# Patient Record
Sex: Female | Born: 1970
Health system: Southern US, Community
[De-identification: ages and names within clinical notes are randomized; demographics above are authoritative.]

## PROBLEM LIST (undated history)

## (undated) ENCOUNTER — Inpatient Hospital Stay (HOSPITAL_COMMUNITY): Payer: Self-pay

## (undated) DIAGNOSIS — E785 Hyperlipidemia, unspecified: Secondary | ICD-10-CM

## (undated) DIAGNOSIS — T7840XA Allergy, unspecified, initial encounter: Secondary | ICD-10-CM

## (undated) HISTORY — DX: Allergy, unspecified, initial encounter: T78.40XA

## (undated) HISTORY — DX: Hyperlipidemia, unspecified: E78.5

---

## 2009-10-16 ENCOUNTER — Other Ambulatory Visit: Admission: RE | Admit: 2009-10-16 | Discharge: 2009-10-16 | Payer: Self-pay | Admitting: Family Medicine

## 2009-10-16 ENCOUNTER — Encounter: Payer: Self-pay | Admitting: Family Medicine

## 2009-10-16 ENCOUNTER — Ambulatory Visit: Payer: Self-pay | Admitting: Family Medicine

## 2009-10-16 DIAGNOSIS — Z87898 Personal history of other specified conditions: Secondary | ICD-10-CM

## 2009-10-16 DIAGNOSIS — R8789 Other abnormal findings in specimens from female genital organs: Secondary | ICD-10-CM

## 2009-10-16 LAB — CONVERTED CEMR LAB: GC Probe Amp, Genital: NEGATIVE

## 2009-10-17 ENCOUNTER — Telehealth: Payer: Self-pay | Admitting: *Deleted

## 2009-10-24 ENCOUNTER — Ambulatory Visit: Payer: Self-pay | Admitting: Family Medicine

## 2009-10-24 ENCOUNTER — Encounter: Payer: Self-pay | Admitting: Family Medicine

## 2009-10-24 LAB — CONVERTED CEMR LAB
AST: 21 units/L (ref 0–37)
BUN: 10 mg/dL (ref 6–23)
Calcium: 9.3 mg/dL (ref 8.4–10.5)
Chloride: 104 meq/L (ref 96–112)
Cholesterol: 298 mg/dL — ABNORMAL HIGH (ref 0–200)
Creatinine, Ser: 0.77 mg/dL (ref 0.40–1.20)
HDL: 65 mg/dL (ref >39)
Total Bilirubin: 0.4 mg/dL (ref 0.3–1.2)
Total CHOL/HDL Ratio: 4.6
VLDL: 11 mg/dL (ref 0–40)

## 2009-10-25 ENCOUNTER — Encounter: Payer: Self-pay | Admitting: Family Medicine

## 2010-05-07 NOTE — Assessment & Plan Note (Signed)
Summary: NP,tcb   Vital Signs:  Patient profile:   40 year old female Height:      70 inches Weight:      127.6 pounds BMI:     18.37 Temp:     98.7 degrees F oral Pulse rate:   90 / minute BP sitting:   120 / 84  (left arm) Cuff size:   regular  Vitals Entered By: Garen Grams LPN (October 16, 2009 1:44 PM) CC: New Patient Is Patient Diabetic? No Pain Assessment Patient in pain? no        CC:  New Patient.  History of Present Illness: pt presents for new pt appt.    has had mammograms in past.  they have been normal.  has had an abnormal pap, not sure what the abnormality was.  has had normal colpo in past.   wants to have another baby in the next year or 2.  recently remarried.  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 Take One Daily  Allergies (verified): No Known Drug Allergies  Past History:  Past Medical History: history of abnormal paps, normal colpo G1P1 history of HL never been on statin  Past Surgical History: none  Family History: father with prostate Ca HTN HL   Social History: lives with her mother and father as well as new husband Iantha Fallen and daughter Fonda Kinder (2005).  works at the [employer].  Likes to swim and travel.  No alcohol, tobacco, drug use.  Smoking Status:  never  Review of Systems  The patient denies anorexia, fever, weight loss, weight gain, vision loss, decreased hearing, hoarseness, chest pain, syncope, dyspnea on exertion, peripheral edema, prolonged cough, headaches, hemoptysis, abdominal pain, melena, hematochezia, severe indigestion/heartburn, hematuria, incontinence, genital sores, muscle weakness, suspicious skin lesions, transient blindness, difficulty walking, depression, unusual weight change, abnormal bleeding,  enlarged lymph nodes, angioedema, breast masses, and testicular masses.    Physical Exam  General:  Well-developed,well-nourished,in no acute distress; alert,appropriate and cooperative throughout examination Head:  Normocephalic and atraumatic without obvious abnormalities. No apparent alopecia or balding. Eyes:  PERRL, nonicteric Ears:  External ear exam shows no significant lesions or deformities.  Otoscopic examination reveals clear canals, tympanic membranes are intact bilaterally without bulging, retraction, inflammation or discharge. Hearing is grossly normal bilaterally. Nose:  External nasal examination shows no deformity or inflammation. Nasal mucosa are pink and moist without lesions or exudates. Mouth:  Oral mucosa and oropharynx without lesions or exudates.  Teeth in good repair. Neck:  No deformities, masses, or tenderness noted. Breasts:  No mass, nodules, thickening, tenderness, bulging, retraction, inflamation, nipple discharge or skin changes noted.   Lungs:  Normal respiratory effort, chest expands symmetrically. Lungs are clear to auscultation, no crackles or wheezes. Heart:  Normal rate and regular rhythm. S1 and S2 normal without gallop, murmur, click, rub or other extra sounds. Abdomen:  Bowel sounds positive,abdomen soft and non-tender without masses, organomegaly or hernias noted. Genitalia:  Pelvic Exam:        External: normal female genitalia without lesions or masses        Vagina: normal without lesions or masses        Cervix: normal with small red polyp        Adnexa: normal bimanual exam without masses or fullness  Uterus: normal by palpation        Pap smear performed Msk:  No deformity or scoliosis noted of thoracic or lumbar spine.   Extremities:  No clubbing, cyanosis, edema, or deformity noted with normal full range of motion of all joints.   Neurologic:  No cranial nerve deficits noted. Station and gait are normal. Plantar reflexes are down-going  bilaterally. DTRs are symmetrical throughout. Sensory, motor and coordinative functions appear intact. Skin:  Intact without suspicious lesions or rashes Psych:  Cognition and judgment appear intact. Alert and cooperative with normal attention span and concentration. No apparent delusions, illusions, hallucinations   Impression & Recommendations:  Problem # 1:  OTH ABNORMAL PAPANICOLAOU SMEAR CERVIX&CERV HPV (ICD-795.09) Assessment New hx of abnormal pap with normal colpo.  has cervical polyp on exam.  obtained pap today.  will use results to help guide future care.  had pt sign a release of info to get records of previous paps.  will review when they arrive.  Problem # 2:  CONTRACEPTIVE MANAGEMENT (ICD-V25.09) Assessment: New Pt desires to get pregnant in the next year.  newly married, waiting for husband to find job.  discussed how her age may affect pregnancy.  encouraged heathy diet and exercise as well as multivitamin with folic acid.  discussed genetic testing when she does become pregnant.  continue OCPs for now.  Problem # 3:  HYPERLIPIDEMIA, MILD, HX OF (ICD-V13.8) Assessment: New  Pt states that she has a hx of elevated cholesterol but has never been on statin bc she did not take them when they were perscribed.  obtain FLP to eval.  would NOT prescribe statin as she is trying to become pregnant soon.  Future Orders: Comp Met-[facility] [id number]) ... 10/18/2010 Lipid-[facility] ([id number]) ... 10/31/2010  Problem # 4:  SCREENING FOR MALIGNANT NEOPLASM OF THE CERVIX (ICD-V76.2) Assessment: New yearly WWE preformed today.  pt desires mammogram in the next year.  she will get old records. Orders: Pap Smear-[facility] ([id number])  Complete Medication List: 1)  Necon 1/35 (28) 1-35 Mg-mcg Tabs (Norethindrone-eth estradiol) .... Take one daily  Other Orders: GC/Chlamydia-[facility] ([id number])  Patient Instructions: 1)  It was nice to meet you today 2)  please make a lab appt for fasting blood  tests. 3)  Advised not to eat any food or drink any liquids after 10 PM the night before your tests 4)  I will send you a letter with your results Prescriptions: NECON 1/35 (28) 1-35 MG-MCG TABS (NORETHINDRONE-ETH ESTRADIOL) take one daily  #1 x 12   Entered and Authorized by:   Ellery Plunk MD   Signed by:   Ellery Plunk MD on 10/16/2009   Method used:   Print then Give to Patient   RxID:   [id number]

## 2010-05-07 NOTE — Progress Notes (Signed)
Summary: Rx quest  Phone Note Call from Patient Call back at Home Phone [phone]   Reason for Call: Talk to Nurse Summary of Call: pt received rx for birth control wants to know when to start it? Initial call taken by: Knox Royalty,  October 17, 2009 12:03 PM  Follow-up for Phone Call        told her she should start on Sunday per package instructions. use condoms or abstain for at least a week Follow-up by: Golden Circle RN,  October 17, 2009 12:13 PM

## 2010-05-07 NOTE — Letter (Signed)
Summary: Lipid Letter  [facility]  [address]   [place], [place] [address]   Phone: [phone]  Fax: [phone]    10/25/2009  Wendolyn Raso [address] [place], [place]  [address]  Dear Alcario Drought:  We have carefully reviewed your last lipid profile from  and the results are noted below with a summary of recommendations for lipid management.  Your bad cholesterol LDL was 222.  However, since you are trying to have a baby soon, we should not start a medication until after that.  Please start taking an over the counter fish oil capsule.  This should help some.  After you have the baby, we can recheck and start a medication.       TLC Diet (Therapeutic Lifestyle Change): Saturated Fats & Transfatty acids should be kept < 7% of total calories ***Reduce Saturated Fats Polyunstaurated Fat can be up to 10% of total calories Monounsaturated Fat Fat can be up to 20% of total calories Total Fat should be no greater than 25-35% of total calories Carbohydrates should be 50-60% of total calories Protein should be approximately 15% of total calories Fiber should be at least 20-30 grams a day ***Increased fiber may help lower LDL Total Cholesterol should be < 200mg /day Consider adding plant stanol/sterols to diet (example: Benacol spread) ***A higher intake of unsaturated fat may reduce Triglycerides and Increase HDL    Adjunctive Measures (may lower LIPIDS and reduce risk of Heart Attack) include: Aerobic Exercise (20-30 minutes 3-4 times a week) Limit Alcohol Consumption Weight Reduction Dietary Fiber 20-30 grams a day by mouth     Current Medications: 1)    Necon 1/35 (28) 1-35 Mg-mcg Tabs (Norethindrone-eth estradiol) .... Take one daily  If you have any questions, please call. We appreciate being able to work with you.   Sincerely,    [facility] Ellery Plunk MD  Appended Document: Lipid Letter mailed

## 2010-05-07 NOTE — Miscellaneous (Signed)
Summary: ROI  ROI   Imported By: Knox Royalty 10/27/2009 13:43:22  _____________________________________________________________________  External Attachment:    Type:   Image     Comment:   External Document

## 2010-05-22 ENCOUNTER — Encounter: Payer: Self-pay | Admitting: *Deleted

## 2010-09-04 ENCOUNTER — Other Ambulatory Visit: Payer: Self-pay | Admitting: Family Medicine

## 2010-09-04 ENCOUNTER — Other Ambulatory Visit: Payer: Self-pay | Admitting: Sports Medicine

## 2010-09-04 DIAGNOSIS — Z1231 Encounter for screening mammogram for malignant neoplasm of breast: Secondary | ICD-10-CM

## 2010-09-17 ENCOUNTER — Ambulatory Visit: Payer: Self-pay

## 2010-09-27 ENCOUNTER — Ambulatory Visit
Admission: RE | Admit: 2010-09-27 | Discharge: 2010-09-27 | Disposition: A | Discharge status: Home / Self Care | Payer: 59 | Source: Ambulatory Visit | Attending: Family Medicine | Admitting: Family Medicine

## 2010-09-27 DIAGNOSIS — Z1231 Encounter for screening mammogram for malignant neoplasm of breast: Secondary | ICD-10-CM

## 2010-10-26 ENCOUNTER — Other Ambulatory Visit: Payer: Self-pay | Admitting: Family Medicine

## 2010-10-27 NOTE — Telephone Encounter (Signed)
Refill request

## 2010-10-28 ENCOUNTER — Telehealth: Payer: Self-pay | Admitting: Family Medicine

## 2010-10-28 ENCOUNTER — Other Ambulatory Visit: Payer: Self-pay | Admitting: Family Medicine

## 2010-10-28 MED ORDER — NORETHINDRONE-ETH ESTRADIOL 1-35 MG-MCG PO TABS: 1.0000 | ORAL_TABLET | Freq: Every day | ORAL | Status: DC

## 2010-10-28 NOTE — Telephone Encounter (Signed)
Called pt. 2 month supply rx. Will see Dr. Kathie Rhodes on 3rd August

## 2010-10-28 NOTE — Telephone Encounter (Signed)
Patient wanted a 90 day supply of Birth Control pills. Dr. Denyse Amass said no. He gave her 1 pack and 1 refill to last until she comes in to office

## 2010-10-28 NOTE — Telephone Encounter (Signed)
Pt found out her BCP are expired and she called into pharm on Sat- ( they sent an electronic rx) - her appt is Aug. 3rd and is not sure if she can get this until appt. pls advise.

## 2010-10-28 NOTE — Telephone Encounter (Addendum)
Advised patient that I will send message to Dr. Hulen Luster to advises about refilling.  [facility]   Dr. Hulen Luster is on vacation . Will send to Dr. Denyse Amass

## 2010-10-28 NOTE — Telephone Encounter (Signed)
Refill request for Dr. Jeri Lager patient . She has appointment 08/03 with Dr. Hulen Luster.

## 2010-11-08 ENCOUNTER — Encounter: Payer: 59 | Admitting: Family Medicine

## 2010-11-15 ENCOUNTER — Encounter: Payer: 59 | Admitting: Family Medicine

## 2010-11-29 ENCOUNTER — Encounter: Payer: 59 | Admitting: Family Medicine

## 2010-12-06 ENCOUNTER — Encounter: Payer: Self-pay | Admitting: Family Medicine

## 2010-12-06 ENCOUNTER — Ambulatory Visit (INDEPENDENT_AMBULATORY_CARE_PROVIDER_SITE_OTHER): Payer: 59 | Admitting: Family Medicine

## 2010-12-06 VITALS — BP 122/82 | HR 89 | Temp 98.0°F | Wt 133.0 lb

## 2010-12-06 DIAGNOSIS — Z0000 Encounter for general adult medical examination without abnormal findings: Secondary | ICD-10-CM

## 2010-12-06 DIAGNOSIS — Z01419 Encounter for gynecological examination (general) (routine) without abnormal findings: Secondary | ICD-10-CM

## 2010-12-06 MED ORDER — NORETHINDRONE-ETH ESTRADIOL 1-35 MG-MCG PO TABS: 1.0000 | ORAL_TABLET | Freq: Every day | ORAL | Status: DC

## 2010-12-10 ENCOUNTER — Encounter: Payer: Self-pay | Admitting: Family Medicine

## 2010-12-10 NOTE — Progress Notes (Signed)
  Subjective:     Zoe Heath is a 40 y.o. female and is here for a comprehensive physical exam. The patient reports no problems.  History   Social History  . Marital Status: Married    Spouse Name: N/A    Number of Children: N/A  . Years of Education: N/A   Occupational History  . Not on file.   Social History Main Topics  . Smoking status: Never Smoker   . Smokeless tobacco: Not on file  . Alcohol Use: Not on file  . Drug Use: Not on file  . Sexually Active: Not on file   Other Topics Concern  . Not on file   Social History Narrative  . No narrative on file   Health Maintenance  Topic Date Due  . Pap Smear  02/21/1987  . Tetanus/tdap  02/21/1988   Pt reports taht she and her spouse are still considering having a child.  Their financial situation is improved but not 100% yet.  She and he both have a child already from other marriages.  They would like a child but are not set on it.  They are not ready to start trying yet.    Review of Systems Constitutional: negative for anorexia, chills and night sweats Cardiovascular: negative for chest pain, chest pressure/discomfort and dyspnea Gastrointestinal: negative for constipation and diarrhea Neurological: negative for coordination problems, dizziness and speech problems Behavioral/Psych: negative   Objective:    BP 122/82  Pulse 89  Temp(Src) 98 F (36.7 C) (Oral)  Wt 133 lb (60.328 kg)  LMP 11/15/2010 General appearance: alert, cooperative and appears stated age Head: Normocephalic, without obvious abnormality, atraumatic Neck: no adenopathy, no carotid bruit, no JVD, supple, symmetrical, trachea midline and thyroid not enlarged, symmetric, no tenderness/mass/nodules Lungs: clear to auscultation bilaterally Heart: regular rate and rhythm, S1, S2 normal, no murmur, click, rub or gallop Abdomen: soft, non-tender; bowel sounds normal; no masses,  no organomegaly Extremities: extremities normal, atraumatic, no  cyanosis or edema Skin: Skin color, texture, turgor normal. No rashes or lesions    Assessment:    Healthy female exam.      Plan:     Continue OCPs I discussed with her that at her age, it may be difficult to concieve.  I asked her to make an appt if she was not pregnant in 3 cycles after stopping her OCPs.  I would refer her to GYN at that time for fertility counselling/treatment.  I advised her to start trying as soon as possible for the two of them and that she should discuss this with her husband.

## 2011-10-14 ENCOUNTER — Other Ambulatory Visit: Payer: Self-pay | Admitting: Family Medicine

## 2011-10-14 DIAGNOSIS — Z1231 Encounter for screening mammogram for malignant neoplasm of breast: Secondary | ICD-10-CM

## 2011-11-03 ENCOUNTER — Ambulatory Visit
Admission: RE | Admit: 2011-11-03 | Discharge: 2011-11-03 | Disposition: A | Discharge status: Home / Self Care | Payer: 59 | Source: Ambulatory Visit | Attending: Family Medicine | Admitting: Family Medicine

## 2011-11-03 DIAGNOSIS — Z1231 Encounter for screening mammogram for malignant neoplasm of breast: Secondary | ICD-10-CM

## 2011-12-30 ENCOUNTER — Ambulatory Visit (INDEPENDENT_AMBULATORY_CARE_PROVIDER_SITE_OTHER): Payer: 59 | Admitting: Family Medicine

## 2011-12-30 ENCOUNTER — Other Ambulatory Visit: Payer: Self-pay | Admitting: Family Medicine

## 2011-12-30 ENCOUNTER — Encounter: Payer: Self-pay | Admitting: Family Medicine

## 2011-12-30 ENCOUNTER — Other Ambulatory Visit (HOSPITAL_COMMUNITY)
Admission: RE | Admit: 2011-12-30 | Discharge: 2011-12-30 | Disposition: A | Discharge status: Home / Self Care | Payer: 59 | Source: Ambulatory Visit | Attending: Family Medicine | Admitting: Family Medicine

## 2011-12-30 VITALS — BP 123/74 | HR 98 | Temp 98.4°F | Ht 70.0 in | Wt 124.0 lb

## 2011-12-30 DIAGNOSIS — N912 Amenorrhea, unspecified: Secondary | ICD-10-CM

## 2011-12-30 DIAGNOSIS — IMO0002 Reserved for concepts with insufficient information to code with codable children: Secondary | ICD-10-CM

## 2011-12-30 DIAGNOSIS — Z124 Encounter for screening for malignant neoplasm of cervix: Secondary | ICD-10-CM

## 2011-12-30 DIAGNOSIS — O09529 Supervision of elderly multigravida, unspecified trimester: Secondary | ICD-10-CM

## 2011-12-30 DIAGNOSIS — Z3201 Encounter for pregnancy test, result positive: Secondary | ICD-10-CM

## 2011-12-30 DIAGNOSIS — O3680X0 Pregnancy with inconclusive fetal viability, not applicable or unspecified: Secondary | ICD-10-CM

## 2011-12-30 DIAGNOSIS — Z113 Encounter for screening for infections with a predominantly sexual mode of transmission: Secondary | ICD-10-CM | POA: Insufficient documentation

## 2011-12-30 DIAGNOSIS — Z01419 Encounter for gynecological examination (general) (routine) without abnormal findings: Secondary | ICD-10-CM | POA: Insufficient documentation

## 2011-12-30 NOTE — Patient Instructions (Signed)
It was nice to meet you, congratulations on your pregnancy.  I have ordered an ultrasound for dating to see how far along you are.  Please start taking a prenatal vitamin every day.  I will send you a letter with the results of your pap smear.  Please call an OB/GYN office as soon as possible to schedule an appointment, or call our office to schedule your first Prenatal visit with [place] if you want to have your care at [facility].

## 2011-12-31 DIAGNOSIS — IMO0002 Reserved for concepts with insufficient information to code with codable children: Secondary | ICD-10-CM | POA: Insufficient documentation

## 2011-12-31 NOTE — Progress Notes (Signed)
  Subjective:    Patient ID: Zoe Heath, female    DOB: 1971/01/20, 41 y.o.   MRN: [id number]  HPI  Karita comes in due to amenorrhea and a positive pregnancy test at home.  She is not sure when her last menstrual period was, because her periods had been very light and irregular on the pill.  She and her husband had been thinking about having a baby for a while.  She says she also has had some morning sickness over the past several weeks so she was pretty sure she was pregnant.  She stopped taking the birth control pills.   Almeda had one baby several years ago with a prior husband.  She says that she had pre term labor and a preterm birth at around 29 weeks due to an infection, but she cannot remember what infection.  She also has a history of abnormal pap, but her last one two years ago was normal.    She does not smoke, use illicit drugs, drink alcohol.  She does not take any medications besides she used to take birth control pills.   Review of Systems    See HPI Objective:   Physical Exam BP 123/74  Pulse 98  Temp 98.4 F (36.9 C) (Oral)  Ht 5\' 10"  (1.778 m)  Wt 124 lb (56.246 kg)  BMI 17.79 kg/m2 General appearance: alert, cooperative and no distress Pelvic: cervix normal in appearance, external genitalia normal, no adnexal masses or tenderness, no cervical motion tenderness, pregnancy positive findings: gravid uterus, fundus well below umbiliacus , rectovaginal septum normal and vagina normal without discharge       Assessment & Plan:

## 2011-12-31 NOTE — Assessment & Plan Note (Signed)
Congratulated patient and her husband, who are very excited about this pregnancy.  I have had her scheduled for a dating [place] later this week, and encouraged her to start taking a prenatal vitamin.  Pap and GC/Chlamydia done today.  I told Regena to decide where she would like to have her prenatal care done (here is an option if she chooses), but that she would not need a referral since she has [employer] employee insurance.  She says she will decide and schedule an appointment soon.

## 2012-01-01 ENCOUNTER — Encounter: Payer: Self-pay | Admitting: Family Medicine

## 2012-01-07 ENCOUNTER — Encounter (HOSPITAL_COMMUNITY): Payer: Self-pay | Admitting: *Deleted

## 2012-01-07 ENCOUNTER — Inpatient Hospital Stay (HOSPITAL_COMMUNITY)
Admission: AD | Admit: 2012-01-07 | Discharge: 2012-01-07 | Disposition: A | Discharge status: Home / Self Care | Payer: 59 | Source: Ambulatory Visit | Attending: Obstetrics & Gynecology | Admitting: Obstetrics & Gynecology

## 2012-01-07 ENCOUNTER — Inpatient Hospital Stay (HOSPITAL_COMMUNITY): Payer: 59

## 2012-01-07 DIAGNOSIS — O09899 Supervision of other high risk pregnancies, unspecified trimester: Secondary | ICD-10-CM

## 2012-01-07 DIAGNOSIS — O418X90 Other specified disorders of amniotic fluid and membranes, unspecified trimester, not applicable or unspecified: Secondary | ICD-10-CM

## 2012-01-07 DIAGNOSIS — O468X9 Other antepartum hemorrhage, unspecified trimester: Secondary | ICD-10-CM

## 2012-01-07 DIAGNOSIS — R109 Unspecified abdominal pain: Secondary | ICD-10-CM | POA: Insufficient documentation

## 2012-01-07 DIAGNOSIS — O209 Hemorrhage in early pregnancy, unspecified: Secondary | ICD-10-CM | POA: Insufficient documentation

## 2012-01-07 LAB — URINALYSIS, ROUTINE W REFLEX MICROSCOPIC
Nitrite: NEGATIVE
Specific Gravity, Urine: 1.015 (ref 1.005–1.030)
Urobilinogen, UA: 1 mg/dL (ref 0.0–1.0)

## 2012-01-07 LAB — CBC
Platelets: 248 10*3/uL (ref 150–400)
RDW: 12.7 % (ref 11.5–15.5)
WBC: 6.8 10*3/uL (ref 4.0–10.5)

## 2012-01-07 LAB — URINE MICROSCOPIC-ADD ON

## 2012-01-07 LAB — HCG, QUANTITATIVE, PREGNANCY: hCG, Beta Chain, Quant, S: 126799 m[IU]/mL — ABNORMAL HIGH (ref <5)

## 2012-01-07 LAB — POCT PREGNANCY, URINE: Preg Test, Ur: POSITIVE — AB

## 2012-01-07 MED ORDER — PRENATAL PLUS 27-1 MG PO TABS: 1.0000 | ORAL_TABLET | Freq: Every day | ORAL | Status: DC

## 2012-01-07 NOTE — MAU Note (Signed)
Pt states she started cramping at 6pm tonight and then noticed bleeding a little after 6. Bright red liquid fluid noted

## 2012-01-07 NOTE — MAU Provider Note (Signed)
Chief Complaint: Abdominal Pain and Vaginal Bleeding   First contact 2110   SUBJECTIVE HPI: Zoe Heath is a 41 y.o. G2P0101 at [redacted]w[redacted]d by unsure LMP who presents with onset cramping at1800 today. Shortly after had small quantity of BRB per vagina. No longer cramping.  History reviewed. No pertinent past medical history. OB History    Grav Para Term Preterm Abortions TAB SAB Ect Mult Living   2 1 0 1 0 0 0 0 0 1      # Outc Date GA Lbr Len/2nd Wgt Sex Del Anes PTL Lv   1 PRE 10/05   3lb6oz(1.531kg) F SVD None Yes Yes   2 GRA              History reviewed. No pertinent past surgical history. History   Social History  . Marital Status: Married    Spouse Name: N/A    Number of Children: N/A  . Years of Education: N/A   Occupational History  . Not on file.   Social History Main Topics  . Smoking status: Never Smoker   . Smokeless tobacco: Not on file  . Alcohol Use: No  . Drug Use: No  . Sexually Active: Yes    Birth Control/ Protection: Pill   Other Topics Concern  . Not on file   Social History Narrative  . No narrative on file   No current facility-administered medications on file prior to encounter.   No current outpatient prescriptions on file prior to encounter.   No Known Allergies  ROS: Pertinent items in HPI  OBJECTIVE Blood pressure 134/81, pulse 99, temperature 98.6 F (37 C), temperature source Oral, resp. rate 16, last menstrual period 11/04/2011. GENERAL: Well-developed, well-nourished female in no acute distress.  HEENT: Normocephalic HEART: normal rate RESP: normal effort ABDOMEN: Soft, non-tender EXTREMITIES: Nontender, no edema NEURO: Alert and oriented SPECULUM EXAM: NEFG, scant red blood noted and 3 cm fragment of ? clot vs tissue removed from os with spinge forceps, cervix clean BIMANUAL: cervix ftp ext os, thick; uterus NT 8-10 wk size, no adnexal tenderness or masses  LAB RESULTS Results for orders placed during the hospital encounter  of 01/07/12 (from the past 24 hour(s))  URINALYSIS, ROUTINE W REFLEX MICROSCOPIC     Status: Abnormal   Collection Time   01/07/12  8:40 PM      Component Value Range   Color, Urine RED (*) YELLOW   APPearance CLOUDY (*) CLEAR   Specific Gravity, Urine 1.015  1.005 - 1.030   pH 7.5  5.0 - 8.0   Glucose, UA NEGATIVE  NEGATIVE mg/dL   Hgb urine dipstick LARGE (*) NEGATIVE   Bilirubin Urine NEGATIVE  NEGATIVE   Ketones, ur NEGATIVE  NEGATIVE mg/dL   Protein, ur 409 (*) NEGATIVE mg/dL   Urobilinogen, UA 1.0  0.0 - 1.0 mg/dL   Nitrite NEGATIVE  NEGATIVE   Leukocytes, UA TRACE (*) NEGATIVE  URINE MICROSCOPIC-ADD ON     Status: Abnormal   Collection Time   01/07/12  8:40 PM      Component Value Range   Squamous Epithelial / LPF FEW (*) RARE   WBC, UA 3-6  <3 WBC/hpf   RBC / HPF TOO NUMEROUS TO COUNT  <3 RBC/hpf   Bacteria, UA MANY (*) RARE  POCT PREGNANCY, URINE     Status: Abnormal   Collection Time   01/07/12  8:49 PM      Component Value Range   Preg Test, Ur POSITIVE (*)  NEGATIVE  CBC     Status: Abnormal   Collection Time   01/07/12  9:25 PM      Component Value Range   WBC 6.8  4.0 - 10.5 K/uL   RBC 3.51 (*) 3.87 - 5.11 MIL/uL   Hemoglobin 11.2 (*) 12.0 - 15.0 g/dL   HCT 81.1 (*) 91.4 - 78.2 %   MCV 92.6  78.0 - 100.0 fL   MCH 31.9  26.0 - 34.0 pg   MCHC 34.5  30.0 - 36.0 g/dL   RDW 95.6  21.3 - 08.6 %   Platelets 248  150 - 400 K/uL  ABO/RH     Status: Normal   Collection Time   01/07/12  9:25 PM      Component Value Range   ABO/RH(D) AB POS    HCG, QUANTITATIVE, PREGNANCY     Status: Abnormal   Collection Time   01/07/12  9:25 PM      Component Value Range   hCG, Beta Chain, Quant, [place] [id number] (*) <5 mIU/mL    IMAGING  Clinical Data: Bleeding, uncertain LMP  OBSTETRIC <14 WK ULTRASOUND  Technique: Transabdominal ultrasound was performed for evaluation  of the gestation as well as the maternal uterus and adnexal  regions.  Comparison: None.  Intrauterine  gestational sac: Visualized/normal in shape.  Yolk sac: Identified  Embryo: Identified  Cardiac Activity: Identified  Heart Rate: 176 bpm  CRL: 28 mm 9 w 5 d [place] EDC: 08/06/2012  Maternal uterus/Adnexae:  Moderate subchorionic hemorrhage. Ovaries not visualized. Trace  free fluid.  IMPRESSION:  Single intrauterine gestation with cardiac activity documented.  Estimated age of 9 weeks 5 days by crown-rump length.  Moderate subchorionic hemorrhage.  Original Report Authenticated By: Waneta Martins, M.D.  MAU COURSE  ASSESSMENT 1. Bleeding in early pregnancy   2. Subchorionic hemorrhage   3. History of preterm delivery, currently pregnant   G2P0101 at [redacted]w[redacted]d  PLAN Discharge home with bleeding precautions   Medication List     As of 01/07/2012 11:09 PM    TAKE these medications         prenatal vitamin w/FE, FA 27-1 MG Tabs   Take 1 tablet by mouth daily.       Keep appointment with Dr. Claiborne Billings for NOB visit next week Pregnancy precautions AVS   Danae Orleans, CNM 01/07/2012  9:09 PM

## 2012-01-08 ENCOUNTER — Ambulatory Visit (HOSPITAL_COMMUNITY): Payer: 59

## 2012-01-26 ENCOUNTER — Other Ambulatory Visit: Payer: Self-pay

## 2012-01-26 LAB — OB RESULTS CONSOLE GC/CHLAMYDIA
Chlamydia: NEGATIVE
Gonorrhea: NEGATIVE

## 2012-01-26 LAB — OB RESULTS CONSOLE RPR: RPR: NONREACTIVE

## 2012-04-07 NOTE — L&D Delivery Note (Signed)
Delivery Note At 12:10 AM a viable and healthy female was delivered via Vaginal, Spontaneous Delivery (Presentation: Left Occiput Anterior).  APGAR: 7, 9; weight 5 lb 13.9 oz (2661 g).   Placenta status: Intact, Spontaneous.  Cord: 3 vessels   Pt quickly progressed to anterior lip without anesthesia, I was called to attend the delivery but was unable to attend due to attending another delivery.  The amniotic sac delivered to crowning then membranes spontaneously ruptured. The infant quickly delivered after that and the NICU team was called to attend due to the preterm gestational age of the infant.  The infant cried vigorously and was passed to the isolette for the neonatologist.  The CNM arrived and delivered the placenta.  I then arrived after placental delivery.  Perineum was intact without significant laceration.  Bleeding was minimal.  Mother is doing well after delivery and the baby was transferred to the NICU for observation  Anesthesia: None  Episiotomy: None Lacerations: None Suture Repair: None Est. Blood Loss (mL): 250  Mom to postpartum.  Baby to NICU.  Cyncere Sontag H. 07/07/2012, 12:58 AM

## 2012-07-06 ENCOUNTER — Inpatient Hospital Stay (HOSPITAL_COMMUNITY)
Admission: AD | Admit: 2012-07-06 | Discharge: 2012-07-09 | DRG: 775 | Disposition: A | Discharge status: Home / Self Care | Payer: 59 | Source: Ambulatory Visit | Attending: Obstetrics and Gynecology | Admitting: Obstetrics and Gynecology

## 2012-07-06 ENCOUNTER — Encounter (HOSPITAL_COMMUNITY): Payer: Self-pay | Admitting: Obstetrics and Gynecology

## 2012-07-06 DIAGNOSIS — D649 Anemia, unspecified: Secondary | ICD-10-CM | POA: Diagnosis not present

## 2012-07-06 DIAGNOSIS — O09529 Supervision of elderly multigravida, unspecified trimester: Secondary | ICD-10-CM | POA: Diagnosis present

## 2012-07-06 DIAGNOSIS — O9903 Anemia complicating the puerperium: Principal | ICD-10-CM | POA: Diagnosis not present

## 2012-07-06 LAB — URINE MICROSCOPIC-ADD ON

## 2012-07-06 LAB — URINALYSIS, ROUTINE W REFLEX MICROSCOPIC
Bilirubin Urine: NEGATIVE
Glucose, UA: NEGATIVE mg/dL
Ketones, ur: NEGATIVE mg/dL
pH: 6 (ref 5.0–8.0)

## 2012-07-06 LAB — CBC
HCT: 33.3 % — ABNORMAL LOW (ref 36.0–46.0)
Hemoglobin: 11 g/dL — ABNORMAL LOW (ref 12.0–15.0)
MCH: 31.6 pg (ref 26.0–34.0)
MCHC: 33 g/dL (ref 30.0–36.0)
MCV: 95.7 fL (ref 78.0–100.0)
RBC: 3.48 MIL/uL — ABNORMAL LOW (ref 3.87–5.11)

## 2012-07-06 MED ORDER — LACTATED RINGERS IV SOLN
INTRAVENOUS | Status: DC
  Administered 2012-07-06: 21:00:00 via INTRAVENOUS

## 2012-07-06 MED ORDER — OXYTOCIN 40 UNITS IN LACTATED RINGERS INFUSION - SIMPLE MED
62.5000 mL/h | INTRAVENOUS | Status: DC
  Filled 2012-07-06: qty 1000

## 2012-07-06 MED ORDER — LACTATED RINGERS IV SOLN: 500.0000 mL | Freq: Once | INTRAVENOUS | Status: DC

## 2012-07-06 MED ORDER — EPHEDRINE 5 MG/ML INJ
10.0000 mg | INTRAVENOUS | Status: DC | PRN
  Filled 2012-07-06: qty 2

## 2012-07-06 MED ORDER — DIPHENHYDRAMINE HCL 50 MG/ML IJ SOLN: 12.5000 mg | INTRAMUSCULAR | Status: DC | PRN

## 2012-07-06 MED ORDER — IBUPROFEN 600 MG PO TABS
600.0000 mg | ORAL_TABLET | Freq: Four times a day (QID) | ORAL | Status: DC | PRN
  Administered 2012-07-07: 600 mg via ORAL
  Filled 2012-07-06: qty 1

## 2012-07-06 MED ORDER — ACETAMINOPHEN 325 MG PO TABS: 650.0000 mg | ORAL_TABLET | ORAL | Status: DC | PRN

## 2012-07-06 MED ORDER — LIDOCAINE HCL (PF) 1 % IJ SOLN
30.0000 mL | INTRAMUSCULAR | Status: DC | PRN
  Filled 2012-07-06 (×2): qty 30

## 2012-07-06 MED ORDER — OXYTOCIN BOLUS FROM INFUSION
500.0000 mL | INTRAVENOUS | Status: DC
  Administered 2012-07-07: 500 mL via INTRAVENOUS

## 2012-07-06 MED ORDER — FLEET ENEMA 7-19 GM/118ML RE ENEM: 1.0000 | ENEMA | RECTAL | Status: DC | PRN

## 2012-07-06 MED ORDER — LACTATED RINGERS IV SOLN: 500.0000 mL | INTRAVENOUS | Status: DC | PRN

## 2012-07-06 MED ORDER — PHENYLEPHRINE 40 MCG/ML (10ML) SYRINGE FOR IV PUSH (FOR BLOOD PRESSURE SUPPORT)
80.0000 ug | PREFILLED_SYRINGE | INTRAVENOUS | Status: DC | PRN
  Filled 2012-07-06: qty 2

## 2012-07-06 MED ORDER — FENTANYL 2.5 MCG/ML BUPIVACAINE 1/10 % EPIDURAL INFUSION (WH - ANES): 14.0000 mL/h | INTRAMUSCULAR | Status: DC | PRN

## 2012-07-06 MED ORDER — OXYCODONE-ACETAMINOPHEN 5-325 MG PO TABS: 1.0000 | ORAL_TABLET | ORAL | Status: DC | PRN

## 2012-07-06 MED ORDER — SODIUM CHLORIDE 0.9 % IV SOLN
2.0000 g | Freq: Once | INTRAVENOUS | Status: AC
  Administered 2012-07-06: 2 g via INTRAVENOUS
  Filled 2012-07-06: qty 2000

## 2012-07-06 MED ORDER — ONDANSETRON HCL 4 MG/2ML IJ SOLN: 4.0000 mg | Freq: Four times a day (QID) | INTRAMUSCULAR | Status: DC | PRN

## 2012-07-06 MED ORDER — CITRIC ACID-SODIUM CITRATE 334-500 MG/5ML PO SOLN: 30.0000 mL | ORAL | Status: DC | PRN

## 2012-07-06 NOTE — MAU Note (Signed)
Pt states began having ctx's around 2030 last pm, no ptl issues this pregnancy however has hx of ptl/ptd prior pregnancy. Noted pinkish vaginal d/c. Ctx's 6-9 minutes, now 4-6. Had long time where she had none.

## 2012-07-06 NOTE — MAU Provider Note (Signed)
  History     CSN: [id number]  Arrival date and time: 07/06/12 1191   First Provider Initiated Contact with Patient 07/06/12 1944      Chief Complaint  Patient presents with  . Contractions   HPI Pt is [redacted]w[redacted]d pregnant and complains of ctx every 4 to 6 minutes since 9 pm last and went up going to the  Bathroom, due to ?pressure.  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  She ate breakfast and lunch.  She had some pink discharge when she went home.  She denies loss of fluid.  Pt has a hx of preterm delivery at [redacted]w[redacted]d. 3#6oz. Pt has been on 17 P - tomorrow is her last injection.  History reviewed. No pertinent past medical history.  History reviewed. No pertinent past surgical history.  No family history on file.  History  Substance Use Topics  . Smoking status: Never Smoker   . Smokeless tobacco: Not on file  . Alcohol Use: No    Allergies: No Known Allergies  Prescriptions prior to admission  Medication Sig Dispense Refill  . prenatal vitamin w/FE, FA (PRENATAL 1 + 1) 27-1 MG TABS Take 1 tablet by mouth daily.  30 each  0    ROS Physical Exam   Blood pressure 148/88, pulse 109, temperature 98.4 F (36.9 C), temperature source Oral, resp. rate 16, height 5\' 9"  (1.753 m), weight 71.385 kg (157 lb 6 oz), last menstrual period 11/04/2011.  Physical Exam  MAU Course  Procedures Ctx every 3-4 minutes FHR reassuring for gestational age Cervical exam by RN 5-6 cm with bulging bag Dr. Tenny Craw notified    Assessment and Plan    Zoe Heath 07/06/2012, 7:44 PM

## 2012-07-07 ENCOUNTER — Encounter (HOSPITAL_COMMUNITY): Payer: Self-pay | Admitting: *Deleted

## 2012-07-07 LAB — CBC
MCHC: 32.9 g/dL (ref 30.0–36.0)
Platelets: 191 10*3/uL (ref 150–400)
RDW: 13.2 % (ref 11.5–15.5)
WBC: 11.3 10*3/uL — ABNORMAL HIGH (ref 4.0–10.5)

## 2012-07-07 MED ORDER — OXYCODONE-ACETAMINOPHEN 5-325 MG PO TABS
1.0000 | ORAL_TABLET | ORAL | Status: DC | PRN
  Administered 2012-07-08: 1 via ORAL
  Filled 2012-07-07: qty 1

## 2012-07-07 MED ORDER — PRENATAL MULTIVITAMIN CH
1.0000 | ORAL_TABLET | Freq: Every day | ORAL | Status: DC
  Administered 2012-07-07 – 2012-07-09 (×3): 1 via ORAL
  Filled 2012-07-07 (×3): qty 1

## 2012-07-07 MED ORDER — LANOLIN HYDROUS EX OINT: TOPICAL_OINTMENT | CUTANEOUS | Status: DC | PRN

## 2012-07-07 MED ORDER — DIBUCAINE 1 % RE OINT: 1.0000 "application " | TOPICAL_OINTMENT | RECTAL | Status: DC | PRN

## 2012-07-07 MED ORDER — ONDANSETRON HCL 4 MG/2ML IJ SOLN: 4.0000 mg | INTRAMUSCULAR | Status: DC | PRN

## 2012-07-07 MED ORDER — WITCH HAZEL-GLYCERIN EX PADS: 1.0000 "application " | MEDICATED_PAD | CUTANEOUS | Status: DC | PRN

## 2012-07-07 MED ORDER — ONDANSETRON HCL 4 MG PO TABS: 4.0000 mg | ORAL_TABLET | ORAL | Status: DC | PRN

## 2012-07-07 MED ORDER — SIMETHICONE 80 MG PO CHEW: 80.0000 mg | CHEWABLE_TABLET | ORAL | Status: DC | PRN

## 2012-07-07 MED ORDER — BENZOCAINE-MENTHOL 20-0.5 % EX AERO
1.0000 "application " | INHALATION_SPRAY | CUTANEOUS | Status: DC | PRN
  Administered 2012-07-07: 1 via TOPICAL
  Filled 2012-07-07: qty 56

## 2012-07-07 MED ORDER — IBUPROFEN 600 MG PO TABS
600.0000 mg | ORAL_TABLET | Freq: Four times a day (QID) | ORAL | Status: DC
  Administered 2012-07-07 – 2012-07-09 (×10): 600 mg via ORAL
  Filled 2012-07-07 (×10): qty 1

## 2012-07-07 MED ORDER — TETANUS-DIPHTH-ACELL PERTUSSIS 5-2.5-18.5 LF-MCG/0.5 IM SUSP
0.5000 mL | Freq: Once | INTRAMUSCULAR | Status: AC
  Administered 2012-07-08: 0.5 mL via INTRAMUSCULAR
  Filled 2012-07-07: qty 0.5

## 2012-07-07 MED ORDER — SENNOSIDES-DOCUSATE SODIUM 8.6-50 MG PO TABS
2.0000 | ORAL_TABLET | Freq: Every day | ORAL | Status: DC
  Administered 2012-07-08: 2 via ORAL

## 2012-07-07 MED ORDER — DIPHENHYDRAMINE HCL 25 MG PO CAPS: 25.0000 mg | ORAL_CAPSULE | Freq: Four times a day (QID) | ORAL | Status: DC | PRN

## 2012-07-07 MED ORDER — ZOLPIDEM TARTRATE 5 MG PO TABS: 5.0000 mg | ORAL_TABLET | Freq: Every evening | ORAL | Status: DC | PRN

## 2012-07-07 NOTE — Progress Notes (Signed)
Admission nutrition screen triggered. Patients chart reviewed and assessed  for nutritional risk. Adequate weight gain of 30 lbs during pregnancy. Patient is determined to be at low nutrition  risk.   Zoe Heath M.Odis Luster LDN Neonatal Nutrition Support Specialist Pager [phone]

## 2012-07-07 NOTE — Progress Notes (Signed)
07/07/12 1500  Clinical Encounter Type  Visited With Patient and family together  Visit Type Initial;Spiritual support;Social support  Referral From Nurse  Spiritual Encounters  Spiritual Needs Emotional   Zoe Heath was friendly and upbeat on this initial visit.  Per pt, her older child was in the NICU for a month and five days, so this shorter stay is "a piece of cake" in comparison.  She states that she's committed to making pumping work this time, after experience with first baby was "not so successful."    Introduced spiritual care and chaplain availability.  Offered encouragement and celebration.  Pt states no needs at this time.  [address] [place], [place] [phone]

## 2012-07-07 NOTE — Progress Notes (Signed)
Ur chart review completed.  

## 2012-07-07 NOTE — Consult Note (Signed)
Neonatology Note:  Attendance at Code Apgar:  Our team responded to a Code Apgar call to room # 168 following NSVD, due to infant born precipitously at 35 5/[redacted] weeks GA. The requesting physician was Dr. Kendra Ross. The mother is a G2P0A1 AB pos, GBS unavailable with history of previous preterm birth and subchorionic hemorrhage early in this pregnancy. ROM occurred just prior to delivery and the fluid was clear. The mother received a dose of Ampicillin 3 hours before delivery and she was afebrile. At delivery, the baby cried spontaneously, but was cyanotic. The OB nursing staff in attendance gave vigorous stimulation and a Code Apgar was called. Our team arrived at 2 minutes of life, at which time the baby was being placed on the radiant warmer, dusky but breathing, with HR > 100 and good tone. We placed a pulse oximeter and observed the baby. He pinked up very slowly and still had O2 saturations of 80% at 10 minutes of life, having periodic breathing and occasional subcostal retractions and nasal flaring. We gave BBO2, then allowed the parents to hold him briefly before transporting him to the NICU for further care. Ap 7/9. I spoke with the parents in the DR, then transported the baby to the NICU with his father in attendance.  Zoe Fulgham C. Aylssa Herrig, MD  

## 2012-07-07 NOTE — H&P (Signed)
Zoe Heath is a 42 y.o. female presenting for contractions  42 yo G2P0101 @ 35+5 presents for painful contractions and she was found to be 6-7 cm dilated.  Her pregnancy has been complicated by a h/o preterm labor with preterm delivery. She has been on 17P throughout her pregnancy History OB History   Grav Para Term Preterm Abortions TAB SAB Ect Mult Living   2 1 0 1 0 0 0 0 0 1      History reviewed. No pertinent past medical history. History reviewed. No pertinent past surgical history. Family History: family history is not on file. Social History:  reports that she has never smoked. She does not have any smokeless tobacco history on file. She reports that she does not drink alcohol or use illicit drugs.   Prenatal Transfer Tool  Maternal Diabetes: No Genetic Screening: Normal Maternal Ultrasounds/Referrals: Normal Fetal Ultrasounds or other Referrals:  None Maternal Substance Abuse:  No Significant Maternal Medications:  Meds include: Other: 17P Significant Maternal Lab Results:  None Other Comments:  None  ROS: as above  Dilation: 10 Effacement (%): 100 Station: +1;+2 Exam by:: K Fraley RN Blood pressure 146/83, pulse 72, temperature 98.8 F (37.1 C), temperature source Oral, resp. rate 18, height 5\' 9"  (1.753 m), weight 71.385 kg (157 lb 6 oz), last menstrual period 11/04/2011. Exam Physical Exam  Prenatal labs: ABO, Rh: --/--/AB POS (10/02 2125) Antibody: Negative (10/21 0000) Rubella: Immune (10/21 0000) RPR: Nonreactive (10/21 0000)  HBsAg: Negative (10/21 0000)  HIV: Non-reactive (10/21 0000)  GBS:     Assessment/Plan: 1) Admit 2) Anticipate svd  [name] H. 07/07/2012, 1:05 AM

## 2012-07-08 NOTE — Progress Notes (Signed)
Patient is eating, ambulating, voiding.  Pain control is good.  Lochia appropriate. No other complaints.  Filed Vitals:   07/07/12 1802 07/07/12 2125 07/08/12 0515 07/08/12 1200  BP: 124/75 116/70 141/75 136/80  Pulse: 72 67 67 74  Temp: 98.3 F (36.8 C) 97.9 F (36.6 C) 98.2 F (36.8 C) 98.4 F (36.9 C)  TempSrc: Oral Oral Oral Oral  Resp: 18 18 18 18   Height:      Weight:      SpO2: 99% 100% 100% 100%    Fundus firm Perineum without swelling.  Lab Results  Component Value Date   WBC 11.3* 07/07/2012   HGB 9.5* 07/07/2012   HCT 28.9* 07/07/2012   MCV 96.0 07/07/2012   PLT 191 07/07/2012    --/--/AB POS (10/02 2125)/RI  A/P Post partum day 1. Normal and mild BPs, asymptomatic Mild anemia - cont PNV with Fe Baby in NICU, doing well, to be observed 72hrs.  Routine care.  Expect d/c tomorrow.    Philip Aspen

## 2012-07-08 NOTE — Progress Notes (Signed)
Clinical Social Work Department BRIEF PSYCHOSOCIAL ASSESSMENT 07/08/2012  Patient:  Zoe Heath,Zoe Heath     Account Number:  [id number]     Admit date:  07/06/2012  Clinical Social Worker:  Ta Fair, LCSW  Date/Time:  07/08/2012 03:00 PM  Referred by:  RN  Date Referred:  07/08/2012 Referred for  Other - See comment   Other Referral:   NICU admission   Interview type:  Patient Other interview type:    PSYCHOSOCIAL DATA Living Status:  FAMILY Admitted from facility:   Level of care:   Primary support name:  Kenneth Welford Primary support relationship to patient:  SPOUSE Degree of support available:   Great support system of family and friends    CURRENT CONCERNS Current Concerns  None Noted   Other Concerns:    SOCIAL WORK ASSESSMENT / PLAN  CSW met with MOB in her third floor room/303 to introduce myself and complete assessment for NICU admission.  CSW explained support services offered by NICU CSW and gave contact information.  CSW notes that MOB seems to be coping very well with the situation.  CSW has no social concerns at this time.  Assessment/plan status:   Other assessment/ plan:   Information/referral to community resources:  No referral needs identified at this time.  PATIENT'S/FAMILY'S RESPONSE TO PLAN OF CARE:  MOB was very pleasant and states she and baby are doing well.  She states that her daughter was born at [redacted] weeks gestation 8 years ago in [place] and is doing great.  She came in to the room while CSW was meeting with MOB.  MOB had family visiting, but stated we could talk.  CSW did not stay long due to MOB's company and that she was eating at CSW's arrival.  She reports good supports and seems to have a good understanding of baby's medical situation.  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  She states no questions or needs at this time and seemed  appreciative of CSW's visit.  

## 2012-07-09 ENCOUNTER — Encounter (HOSPITAL_COMMUNITY)
Admission: RE | Admit: 2012-07-09 | Discharge: 2012-07-09 | Disposition: A | Discharge status: Home / Self Care | Payer: 59 | Source: Ambulatory Visit | Attending: Obstetrics and Gynecology | Admitting: Obstetrics and Gynecology

## 2012-07-09 DIAGNOSIS — O923 Agalactia: Secondary | ICD-10-CM | POA: Insufficient documentation

## 2012-07-09 NOTE — Lactation Note (Addendum)
This note was copied from the chart of Zoe Chantel Teti. Lactation Consultation Note Mom states she is pumping; mom has some questions and concerns about the pump and hand expression. Reviewed in detail pumping and hand expression.  Mom states she might want to rent a pump; pump rental packet provided and reviewed with mom. Will check on her later. Discussed lactation services and BFSG with mom. Questions answered.  1330 Rental pump provided; paper work signed, down payment paid. Mom verbalize understanding of how to use the pump.  Patient Name: Zoe Heath [id number] Date: 07/09/2012 Reason for consult: Follow-up assessment;NICU baby   Maternal Data Has patient been taught Hand Expression?: Yes (reviewed)  Feeding Feeding Type: Formula Feeding method: Bottle Nipple Type: Slow - flow Length of feed: 10 min  LATCH Score/Interventions Latch: Too sleepy or reluctant, no latch achieved, no sucking elicited.  Intervention(s): Skin to skin  Type of Nipple: Everted at rest and after stimulation  Comfort (Breast/Nipple): Soft / non-tender  Problem noted: Mild/Moderate discomfort Interventions (Mild/moderate discomfort): Hand expression  Intervention(s): Support Pillows;Skin to skin     Lactation Tools Discussed/Used Tools: Pump Breast pump type: Double-Electric Breast Pump Pump Review: Setup, frequency, and cleaning;Milk Storage   Consult Status Follow-up type: Call as needed    Lenard Forth 07/09/2012, 11:31 AM

## 2012-07-09 NOTE — Progress Notes (Signed)
Pt ambulated out with spouse  Teaching complete   financial counselor seen  Pt     Questions answered  Baby remains in nicu

## 2012-07-09 NOTE — Discharge Summary (Signed)
Obstetric Discharge Summary Reason for Admission: onset of labor Prenatal Procedures: ultrasound and weekly 170H Progesterone  Intrapartum Procedures: spontaneous vaginal delivery Postpartum Procedures: none Complications-Operative and Postpartum: none Hemoglobin  Date Value Range Status  07/07/2012 9.5* 12.0 - 15.0 g/dL Final     HCT  Date Value Range Status  07/07/2012 28.9* 36.0 - 46.0 % Final    Physical Exam:  General: alert Lochia: appropriate Uterine Fundus: firm  Discharge Diagnoses: Premature labor and delivery at 35/5 weeks  Discharge Information: Date: 07/09/2012 Activity: pelvic rest Diet: routine Medications: PNV and Ibuprofen Condition: stable Instructions: refer to practice specific booklet Discharge to: home Follow-up Information   Follow up with Almon Hercules., MD. Schedule an appointment as soon as possible for a visit in 4 weeks.   Contact information:   [address] [place] [address] [phone]       Newborn Data: Live born female  Birth Weight: 5 lb 13.9 oz (2661 g) APGAR: 7, 9  Remains in observation in NICU.  Jasa Dundon D 07/09/2012, 9:06 AM

## 2012-08-09 ENCOUNTER — Encounter (HOSPITAL_COMMUNITY)
Admission: RE | Admit: 2012-08-09 | Discharge: 2012-08-09 | Disposition: A | Discharge status: Home / Self Care | Payer: 59 | Source: Ambulatory Visit | Attending: Obstetrics and Gynecology | Admitting: Obstetrics and Gynecology

## 2012-08-09 DIAGNOSIS — O923 Agalactia: Secondary | ICD-10-CM | POA: Insufficient documentation

## 2012-09-09 ENCOUNTER — Encounter (HOSPITAL_COMMUNITY)
Admission: RE | Admit: 2012-09-09 | Discharge: 2012-09-09 | Disposition: A | Discharge status: Home / Self Care | Payer: 59 | Source: Ambulatory Visit | Attending: Obstetrics and Gynecology | Admitting: Obstetrics and Gynecology

## 2012-09-09 DIAGNOSIS — O923 Agalactia: Secondary | ICD-10-CM | POA: Insufficient documentation

## 2012-10-07 ENCOUNTER — Other Ambulatory Visit: Payer: Self-pay

## 2012-10-07 DIAGNOSIS — Z1231 Encounter for screening mammogram for malignant neoplasm of breast: Secondary | ICD-10-CM

## 2012-11-04 ENCOUNTER — Ambulatory Visit: Payer: 59

## 2012-11-16 ENCOUNTER — Ambulatory Visit
Admission: RE | Admit: 2012-11-16 | Discharge: 2012-11-16 | Disposition: A | Discharge status: Home / Self Care | Payer: 59 | Source: Ambulatory Visit

## 2012-11-16 DIAGNOSIS — Z1231 Encounter for screening mammogram for malignant neoplasm of breast: Secondary | ICD-10-CM

## 2013-10-03 ENCOUNTER — Other Ambulatory Visit: Payer: Self-pay

## 2013-10-04 LAB — CYTOLOGY - PAP

## 2014-02-06 ENCOUNTER — Encounter (HOSPITAL_COMMUNITY): Payer: Self-pay | Admitting: *Deleted

## 2015-06-30 ENCOUNTER — Emergency Department (HOSPITAL_COMMUNITY)
Admission: EM | Admit: 2015-06-30 | Discharge: 2015-06-30 | Disposition: A | Discharge status: Home / Self Care | Payer: 59 | Attending: Emergency Medicine | Admitting: Emergency Medicine

## 2015-06-30 ENCOUNTER — Encounter (HOSPITAL_COMMUNITY): Payer: Self-pay | Admitting: Emergency Medicine

## 2015-06-30 DIAGNOSIS — S51811A Laceration without foreign body of right forearm, initial encounter: Secondary | ICD-10-CM | POA: Insufficient documentation

## 2015-06-30 DIAGNOSIS — W228XXA Striking against or struck by other objects, initial encounter: Secondary | ICD-10-CM | POA: Insufficient documentation

## 2015-06-30 DIAGNOSIS — S6991XA Unspecified injury of right wrist, hand and finger(s), initial encounter: Secondary | ICD-10-CM | POA: Diagnosis present

## 2015-06-30 DIAGNOSIS — Y9289 Other specified places as the place of occurrence of the external cause: Secondary | ICD-10-CM | POA: Diagnosis not present

## 2015-06-30 DIAGNOSIS — S61511A Laceration without foreign body of right wrist, initial encounter: Secondary | ICD-10-CM | POA: Insufficient documentation

## 2015-06-30 DIAGNOSIS — Y998 Other external cause status: Secondary | ICD-10-CM | POA: Insufficient documentation

## 2015-06-30 DIAGNOSIS — Y9389 Activity, other specified: Secondary | ICD-10-CM | POA: Diagnosis not present

## 2015-06-30 DIAGNOSIS — S41111A Laceration without foreign body of right upper arm, initial encounter: Secondary | ICD-10-CM

## 2015-06-30 MED ORDER — LIDOCAINE HCL (PF) 1 % IJ SOLN
5.0000 mL | Freq: Once | INTRAMUSCULAR | Status: DC
  Filled 2015-06-30: qty 5

## 2015-06-30 NOTE — Discharge Instructions (Signed)

## 2015-06-30 NOTE — ED Provider Notes (Signed)
CSN: [id number]     Arrival date & time 06/30/15  2005 History  By signing my name below, I, Zoe Heath, attest that this documentation has been prepared under the direction and in the presence of Zoe Maravilla A [place], PA-C. Electronically Signed: Virgel Heath, ED Scribe. 06/30/2015. 10:22 PM.    Chief Complaint  Patient presents with  . Extremity Laceration    The history is provided by the patient. No language interpreter was used.   HPI Comments: Zoe Heath is a 45 y.o. female who presents to the Emergency Department complaining of mild, not actively bleeding, right wrist laceration that occurred earlier today when she accidentally put her hand through a window. She is unsure of the date of her last tetanus shot but it believes to be in the last 10 years and does not want another one tonight. No weakness, numbness. Denies any other symptoms currently.  History reviewed. No pertinent past medical history. No past surgical history on file. No family history on file. Social History  Substance Use Topics  . Smoking status: Never Smoker   . Smokeless tobacco: None  . Alcohol Use: No   OB History    Gravida Para Term Preterm AB TAB SAB Ectopic Multiple Living   2 2 0 2 0 0 0 0 0 2      Review of Systems  Constitutional: Negative for fever.  Skin: Positive for wound (right wrist laceration).  Neurological: Negative for weakness and numbness.    Allergies  Review of patient's allergies indicates no known allergies.  Home Medications   Prior to Admission medications   Medication Sig Start Date End Date Taking? Authorizing Provider  ibuprofen (ADVIL,MOTRIN) 200 MG tablet Take 200 mg by mouth every 6 (six) hours as needed for moderate pain.   Yes Historical Provider, MD   BP 102/83 mmHg  Pulse 92  Temp(Src) 98.6 F (37 C) (Oral)  Resp 16  Ht 5\' 9"  (1.753 m)  Wt 145 lb (65.772 kg)  BMI 21.40 kg/m2  SpO2 100% Physical Exam  Constitutional: She is oriented to  person, place, and time. She appears well-developed and well-nourished. No distress.  HENT:  Head: Normocephalic and atraumatic.  Neck: Neck supple.  Cardiovascular: Normal rate.   Pulmonary/Chest: Effort normal. No respiratory distress.  Musculoskeletal: Normal range of motion.  FROM all digits of right hand.  Neurological: She is alert and oriented to person, place, and time.  Skin: Skin is warm and dry.  2 cm laceration to volar wrist along ulnar aspect. No deep structure involvement. No FB observed or palpated. 1 cm laceration dorsal distal forearm. No FB observed or palpated.  Psychiatric: She has a normal mood and affect. Her behavior is normal.  Nursing note and vitals reviewed.   ED Course  Procedures   DIAGNOSTIC STUDIES: Oxygen Saturation is 100% on RA, normal by my interpretation.    COORDINATION OF CARE: 9:55 PM Will return to perform laceration repair. Discussed treatment plan with pt at bedside and pt agreed to plan. LACERATION REPAIR Performed by: Zoe Heath A Authorized by: Zoe Heath A Consent: Verbal consent obtained. Risks and benefits: risks, benefits and alternatives were discussed Consent given by: patient Patient identity confirmed: provided demographic data Prepped and Draped in normal sterile fashion Wound explored  Laceration Location: right wrist  Laceration Length: 2cm  No Foreign Bodies seen or palpated  Anesthesia: local infiltration  Local anesthetic: lidocaine 1% w/o epinephrine  Anesthetic total: 2 ml  Irrigation method: syringe Amount  of cleaning: standard  Skin closure: 4-0 ethilon  Number of sutures: 3  Technique: simple interrupted  Patient tolerance: Patient tolerated the procedure well with no immediate complications.  LACERATION REPAIR Performed by: Zoe Heath A Authorized by: Zoe Heath A Consent: Verbal consent obtained. Risks and benefits: risks, benefits and alternatives were discussed Consent  given by: patient Patient identity confirmed: provided demographic data Prepped and Draped in normal sterile fashion Wound explored  Laceration Location: dorsal forearm  Laceration Length: 1cm  No Foreign Bodies seen or palpated  Anesthesia: local infiltration  Local anesthetic: lidocaine 1% w/o epinephrine  Anesthetic total: 1 ml  Irrigation method: syringe Amount of cleaning: standard  Skin closure: 4-0 ethilon  Number of sutures: 2  Technique: simple interrupted  Patient tolerance: Patient tolerated the procedure well with no immediate complications.   MDM   Final diagnoses:  None    1. Laceration right wrist 2. Laceration right forearm  Uncomplicated lacerations repaired as above note. No FB suspected.   I personally performed the services described in this documentation, which was scribed in my presence. The recorded information has been reviewed and is accurate.     Zoe Lange, PA-C 06/30/15 2254  Lacretia Leigh, MD 06/30/15 2350

## 2015-06-30 NOTE — ED Notes (Signed)
Pt states she was swatting at a chicken outside her window and accidentally hit the window with the medial side of her right wrist. There is a laceration that is about in inch and deep, but bleeding is controled.

## 2017-04-23 MED FILL — CHLORHEXIDINE 0.12% RINSE: 0.12 | 16 days supply | Qty: 473 | Fill #0

## 2018-05-21 DIAGNOSIS — Z124 Encounter for screening for malignant neoplasm of cervix: Secondary | ICD-10-CM | POA: Diagnosis not present

## 2018-05-21 DIAGNOSIS — Z1231 Encounter for screening mammogram for malignant neoplasm of breast: Secondary | ICD-10-CM | POA: Diagnosis not present

## 2018-05-21 DIAGNOSIS — Z01419 Encounter for gynecological examination (general) (routine) without abnormal findings: Secondary | ICD-10-CM | POA: Diagnosis not present

## 2018-05-27 MED FILL — metroNIDAZOLE 500 MG TABS: 500 | 1 days supply | Qty: 4 | Fill #0

## 2018-08-25 DIAGNOSIS — Z30433 Encounter for removal and reinsertion of intrauterine contraceptive device: Secondary | ICD-10-CM | POA: Diagnosis not present

## 2018-09-22 DIAGNOSIS — Z30431 Encounter for routine checking of intrauterine contraceptive device: Secondary | ICD-10-CM | POA: Diagnosis not present

## 2019-06-13 ENCOUNTER — Encounter: Payer: Self-pay | Admitting: Obstetrics and Gynecology

## 2019-06-13 DIAGNOSIS — Z01419 Encounter for gynecological examination (general) (routine) without abnormal findings: Secondary | ICD-10-CM | POA: Diagnosis not present

## 2019-06-13 DIAGNOSIS — Z124 Encounter for screening for malignant neoplasm of cervix: Secondary | ICD-10-CM | POA: Diagnosis not present

## 2019-06-13 DIAGNOSIS — Z1231 Encounter for screening mammogram for malignant neoplasm of breast: Secondary | ICD-10-CM | POA: Diagnosis not present

## 2019-06-15 ENCOUNTER — Other Ambulatory Visit: Payer: Self-pay | Admitting: Obstetrics and Gynecology

## 2019-06-15 DIAGNOSIS — R928 Other abnormal and inconclusive findings on diagnostic imaging of breast: Secondary | ICD-10-CM

## 2019-06-23 ENCOUNTER — Ambulatory Visit
Admission: RE | Admit: 2019-06-23 | Discharge: 2019-06-23 | Disposition: A | Discharge status: Home / Self Care | Payer: 59 | Source: Ambulatory Visit | Attending: Obstetrics and Gynecology | Admitting: Obstetrics and Gynecology

## 2019-06-23 ENCOUNTER — Other Ambulatory Visit: Payer: Self-pay

## 2019-06-23 ENCOUNTER — Ambulatory Visit: Payer: 59

## 2019-06-23 DIAGNOSIS — R928 Other abnormal and inconclusive findings on diagnostic imaging of breast: Secondary | ICD-10-CM

## 2019-08-16 DIAGNOSIS — Z1211 Encounter for screening for malignant neoplasm of colon: Secondary | ICD-10-CM | POA: Diagnosis not present

## 2019-09-07 MED FILL — CLENPIQ 10-3.5-12 MG-GM -GM: 10-3.5-12 M | 2 days supply | Qty: 320 | Fill #0

## 2019-09-09 DIAGNOSIS — K6389 Other specified diseases of intestine: Secondary | ICD-10-CM | POA: Diagnosis not present

## 2019-09-09 DIAGNOSIS — D122 Benign neoplasm of ascending colon: Secondary | ICD-10-CM | POA: Diagnosis not present

## 2019-09-09 DIAGNOSIS — K5140 Inflammatory polyps of colon without complications: Secondary | ICD-10-CM | POA: Diagnosis not present

## 2019-09-09 DIAGNOSIS — K635 Polyp of colon: Secondary | ICD-10-CM | POA: Diagnosis not present

## 2019-09-09 DIAGNOSIS — Z1211 Encounter for screening for malignant neoplasm of colon: Secondary | ICD-10-CM | POA: Diagnosis not present

## 2019-12-06 ENCOUNTER — Telehealth: Payer: Self-pay | Admitting: Family Medicine

## 2019-12-06 ENCOUNTER — Other Ambulatory Visit: Payer: Self-pay

## 2019-12-06 ENCOUNTER — Ambulatory Visit: Payer: 59 | Admitting: Family Medicine

## 2019-12-06 VITALS — BP 130/80 | HR 72 | Wt 159.6 lb

## 2019-12-06 DIAGNOSIS — Z8639 Personal history of other endocrine, nutritional and metabolic disease: Secondary | ICD-10-CM | POA: Diagnosis not present

## 2019-12-06 DIAGNOSIS — Z833 Family history of diabetes mellitus: Secondary | ICD-10-CM

## 2019-12-06 DIAGNOSIS — Z0000 Encounter for general adult medical examination without abnormal findings: Secondary | ICD-10-CM

## 2019-12-06 NOTE — Progress Notes (Signed)
    SUBJECTIVE:   Chief compliant/HPI: annual examination  Zoe Heath is a 49 y.o. who presents today for an annual exam.   PMHx: HLD SurgHx: None FHx: Mom (prediabetes), dad (prediabetes, prostate cancer) Social Hx: Works in Associate Professor at [employer]. Married. 2 children (vaginal births, daughter at 24 weeks 3 pounds, son at 35 weeks 5 pounds) dog-boxer/lab Denies alcohol use Never smoker, exposed to secondhand smoke via husband  Health maintenance Pap: Patient states done this year at [facility] via Dr. Rogue Bussing Mammogram: 06/23/2019 normal Colonoscopy: 08/16/2019 normal Covid vaccine: Pfizer x2 Hep C: Order placed today  Review of systems form reviewed and unremarkable.   OBJECTIVE:   BP 130/80   Pulse 72   Wt 159 lb 9.6 oz (72.4 kg)   SpO2 99%   BMI 23.57 kg/m   General: Appears well, no acute distress. Age appropriate. Cardiac: RRR, normal heart sounds, no murmurs Respiratory: CTAB, normal effort Skin: Warm and dry, no rashes noted Neuro: alert and oriented, no focal deficits Psych: normal affect   PHQ9 SCORE ONLY 12/06/2019  PHQ-9 Total Score 0    ASSESSMENT/PLAN:   No problem-specific Assessment & Plan notes found for this encounter.    Annual Examination  See AVS for age appropriate recommendations  PHQ score 0, reviewed.  BP reviewed and at goal.  Asked about intimate partner violence and resources given as appropriate   Considered the following items based upon USPSTF recommendations: Diabetes screening: ordered Screening for elevated cholesterol: ordered HIV testing: discussed and completed Hepatitis C: discussed and ordered Hepatitis B: not ordered  Syphilis if at high risk: discussed and not ordered GC/CT not at high risk and not ordered. Osteoporosis screening considered based upon risk of fracture from [facility] calculator. Major osteoporotic fracture risk is 3.7%. DEXA not ordered.    Discussed family history, BRCA testing not  indicated.  Cervical cancer screening: Records release form to obtain prior pap records Breast cancer screening: UTD Colorectal cancer screening: Release records signed Vaccinations will need flu.   Follow up in 1 year or sooner if indicated.    Gerlene Fee, [place]

## 2019-12-06 NOTE — Telephone Encounter (Signed)
CIVID-19 Vaccination Record Card dropped off for at front desk to be added to chart.  Placed form in red team folder.  Zoe Heath

## 2019-12-06 NOTE — Patient Instructions (Addendum)
It was very nice to meet you today. Please enjoy the rest of your week. Today you were seen to establish care and a well adult visit.   I will call you with abnormal results. If normal I will communicate this via Mychart.  Follow up in 1-2 years for annual wellness exam or sooner if needed.   Preventive Care 25-51 Years Old, Female Preventive care refers to visits with your health care provider and lifestyle choices that can promote health and wellness. This includes:  A yearly physical exam. This may also be called an annual well check.  Regular dental visits and eye exams.  Immunizations.  Screening for certain conditions.  Healthy lifestyle choices, such as eating a healthy diet, getting regular exercise, not using drugs or products that contain nicotine and tobacco, and limiting alcohol use. What can I expect for my preventive care visit? Physical exam Your health care provider will check your:  Height and weight. This may be used to calculate body mass index (BMI), which tells if you are at a healthy weight.  Heart rate and blood pressure.  Skin for abnormal spots. Counseling Your health care provider may ask you questions about your:  Alcohol, tobacco, and drug use.  Emotional well-being.  Home and relationship well-being.  Sexual activity.  Eating habits.  Work and work Statistician.  Method of birth control.  Menstrual cycle.  Pregnancy history. What immunizations do I need?  Influenza (flu) vaccine  This is recommended every year. Tetanus, diphtheria, and pertussis (Tdap) vaccine  You may need a Td booster every 10 years. Varicella (chickenpox) vaccine  You may need this if you have not been vaccinated. Zoster (shingles) vaccine  You may need this after age 51. Measles, mumps, and rubella (MMR) vaccine  You may need at least one dose of MMR if you were born in 1957 or later. You may also need a second dose. Pneumococcal conjugate (PCV13)  vaccine  You may need this if you have certain conditions and were not previously vaccinated. Pneumococcal polysaccharide (PPSV23) vaccine  You may need one or two doses if you smoke cigarettes or if you have certain conditions. Meningococcal conjugate (MenACWY) vaccine  You may need this if you have certain conditions. Hepatitis A vaccine  You may need this if you have certain conditions or if you travel or work in places where you may be exposed to hepatitis A. Hepatitis B vaccine  You may need this if you have certain conditions or if you travel or work in places where you may be exposed to hepatitis B. Haemophilus influenzae type b (Hib) vaccine  You may need this if you have certain conditions. Human papillomavirus (HPV) vaccine  If recommended by your health care provider, you may need three doses over 6 months. You may receive vaccines as individual doses or as more than one vaccine together in one shot (combination vaccines). Talk with your health care provider about the risks and benefits of combination vaccines. What tests do I need? Blood tests  Lipid and cholesterol levels. These may be checked every 5 years, or more frequently if you are over 51 years old.  Hepatitis C test.  Hepatitis B test. Screening  Lung cancer screening. You may have this screening every year starting at age 51 if you have a 30-pack-year history of smoking and currently smoke or have quit within the past 15 years.  Colorectal cancer screening. All adults should have this screening starting at age 51 and continuing until  age 51. Your health care provider may recommend screening at age 51 if you are at increased risk. You will have tests every 1-10 years, depending on your results and the type of screening test.  Diabetes screening. This is done by checking your blood sugar (glucose) after you have not eaten for a while (fasting). You may have this done every 1-3 years.  Mammogram. This may be  done every 1-2 years. Talk with your health care provider about when you should start having regular mammograms. This may depend on whether you have a family history of breast cancer.  BRCA-related cancer screening. This may be done if you have a family history of breast, ovarian, tubal, or peritoneal cancers.  Pelvic exam and Pap test. This may be done every 3 years starting at age 51. Starting at age 28, this may be done every 5 years if you have a Pap test in combination with an HPV test. Other tests  Sexually transmitted disease (STD) testing.  Bone density scan. This is done to screen for osteoporosis. You may have this scan if you are at high risk for osteoporosis. Follow these instructions at home: Eating and drinking  Eat a diet that includes fresh fruits and vegetables, whole grains, lean protein, and low-fat dairy.  Take vitamin and mineral supplements as recommended by your health care provider.  Do not drink alcohol if: ? Your health care provider tells you not to drink. ? You are pregnant, may be pregnant, or are planning to become pregnant.  If you drink alcohol: ? Limit how much you have to 0-1 drink a day. ? Be aware of how much alcohol is in your drink. In the [place] one drink equals one 12 oz bottle of beer (355 mL), one 5 oz glass of wine (148 mL), or one 1 oz glass of hard liquor (44 mL). Lifestyle  Take daily care of your teeth and gums.  Stay active. Exercise for at least 30 minutes on 5 or more days each week.  Do not use any products that contain nicotine or tobacco, such as cigarettes, e-cigarettes, and chewing tobacco. If you need help quitting, ask your health care provider.  If you are sexually active, practice safe sex. Use a condom or other form of birth control (contraception) in order to prevent pregnancy and STIs (sexually transmitted infections).  If told by your health care provider, take low-dose aspirin daily starting at age 4. What's  next?  Visit your health care provider once a year for a well check visit.  Ask your health care provider how often you should have your eyes and teeth checked.  Stay up to date on all vaccines. This information is not intended to replace advice given to you by your health care provider. Make sure you discuss any questions you have with your health care provider. Document Revised: 12/03/2017 Document Reviewed: 12/03/2017 Elsevier Patient Education  [employer].   Please call the clinic at [phone] if you have any concerns. It was our pleasure to serve you.  Dr. Janus Molder

## 2019-12-07 LAB — HCV AB W REFLEX TO QUANT PCR: HCV Ab: <0.1 s/co ratio (ref 0.0–0.9)

## 2019-12-07 LAB — LIPID PANEL
Chol/HDL Ratio: 5.1 ratio — ABNORMAL HIGH (ref 0.0–4.4)
Cholesterol, Total: 261 mg/dL — ABNORMAL HIGH (ref 100–199)
HDL: 51 mg/dL (ref >39)
LDL Chol Calc (NIH): 204 mg/dL — ABNORMAL HIGH (ref 0–99)
Triglycerides: 42 mg/dL (ref 0–149)
VLDL Cholesterol Cal: 6 mg/dL (ref 5–40)

## 2019-12-07 LAB — HCV INTERPRETATION

## 2019-12-07 LAB — HEMOGLOBIN A1C
Est. average glucose Bld gHb Est-mCnc: 120 mg/dL
Hgb A1c MFr Bld: 5.8 % — ABNORMAL HIGH (ref 4.8–5.6)

## 2019-12-07 NOTE — Telephone Encounter (Signed)
Documented. Zoe Heath Holter, CMA

## 2019-12-08 ENCOUNTER — Encounter: Payer: Self-pay | Admitting: Family Medicine

## 2019-12-08 ENCOUNTER — Telehealth: Payer: Self-pay | Admitting: Family Medicine

## 2019-12-08 NOTE — Telephone Encounter (Signed)
Attempted to call patient with results.  Did not leave voicemail.  Message to patient on MyChart.  Gerlene Fee, DO 12/08/2019, 6:08 PM PGY-2, [place]

## 2019-12-14 DIAGNOSIS — H5213 Myopia, bilateral: Secondary | ICD-10-CM | POA: Diagnosis not present

## 2020-01-27 ENCOUNTER — Inpatient Hospital Stay: Payer: 59 | Attending: Oncology

## 2020-01-27 ENCOUNTER — Other Ambulatory Visit: Payer: Self-pay

## 2020-01-27 DIAGNOSIS — Z23 Encounter for immunization: Secondary | ICD-10-CM | POA: Insufficient documentation

## 2020-04-26 IMAGING — MG MM DIGITAL DIAGNOSTIC UNILAT*L* W/ TOMO W/ CAD
6 series · 6 of 14 positions shown · non-contrast
Comparison: Previous exam(s).

CLINICAL DATA: Patient returns today to evaluate a possible LEFT
breast asymmetry questioned on recent screening mammogram.

EXAM:
DIGITAL DIAGNOSTIC UNILATERAL LEFT MAMMOGRAM WITH CAD AND TOMO

[L CC]
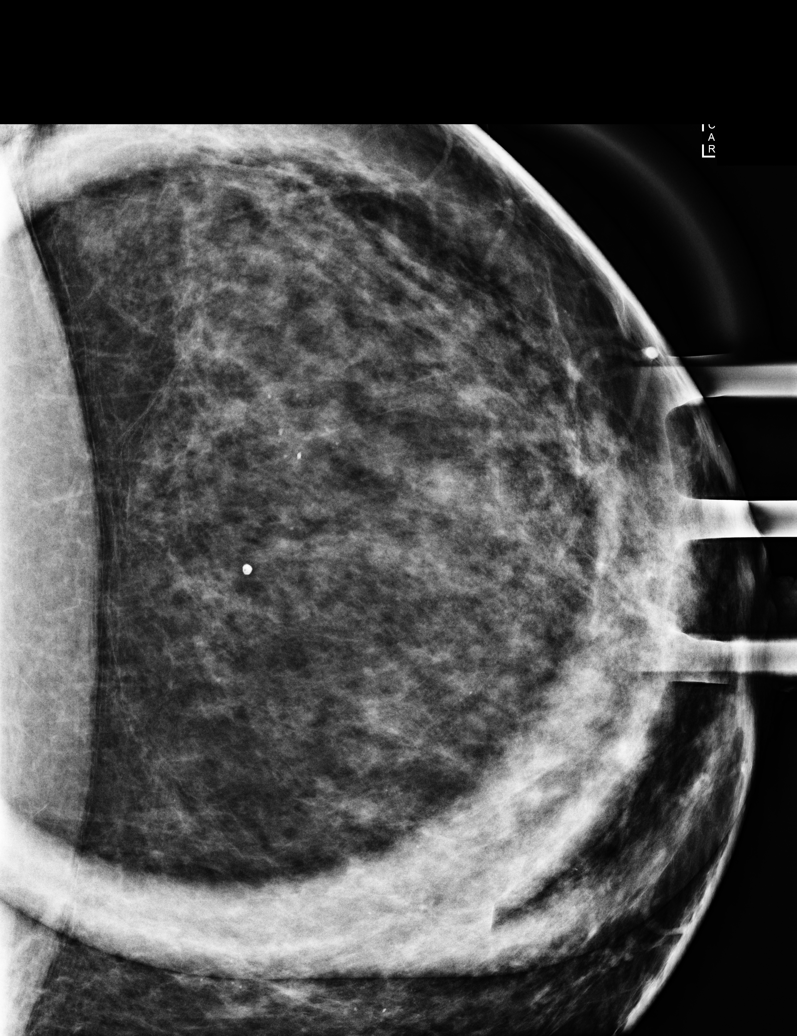

[L ML]
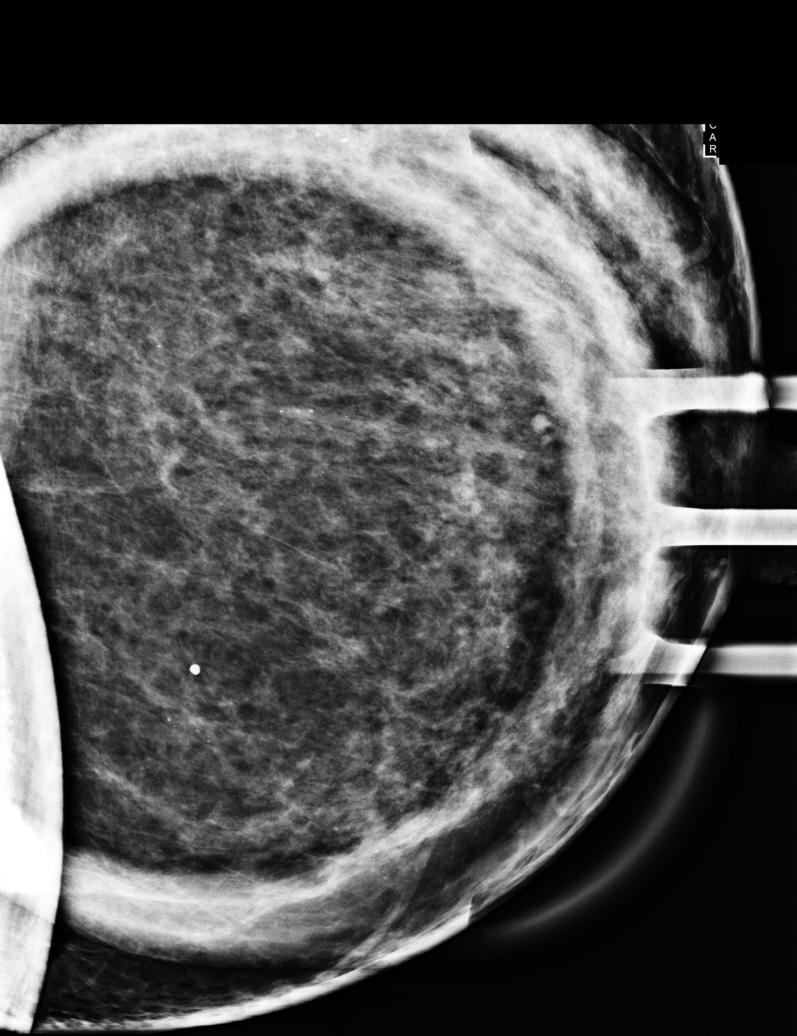

[L MLO synth-2D]
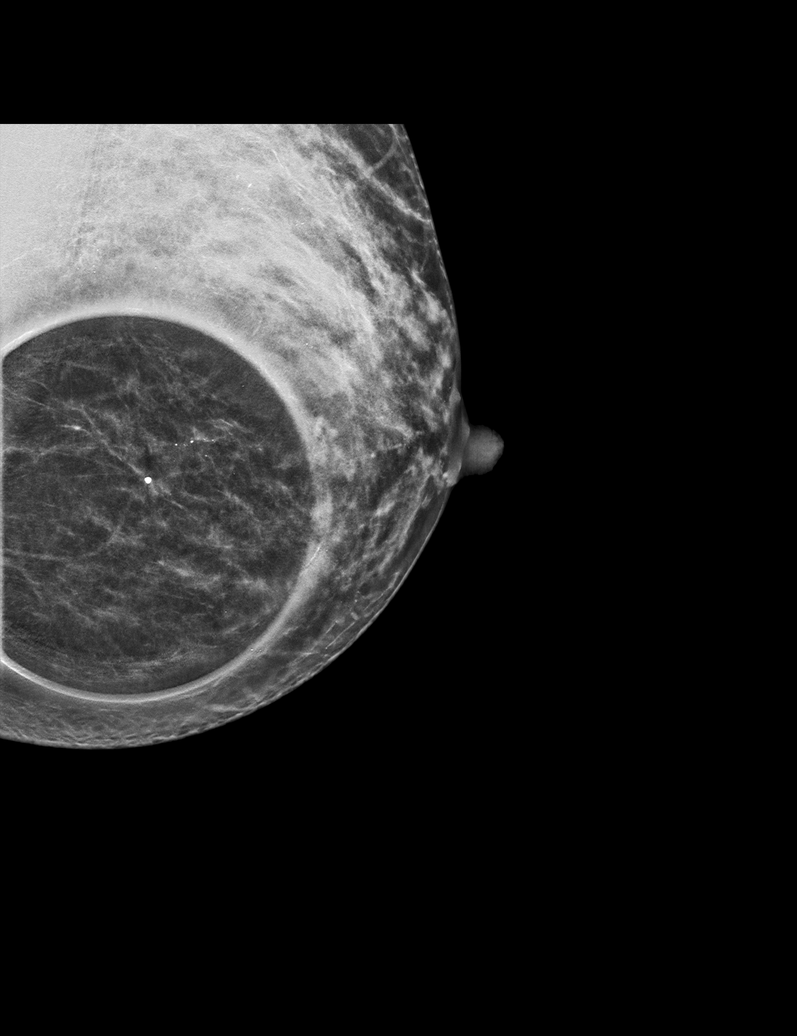

[L ML synth-2D]
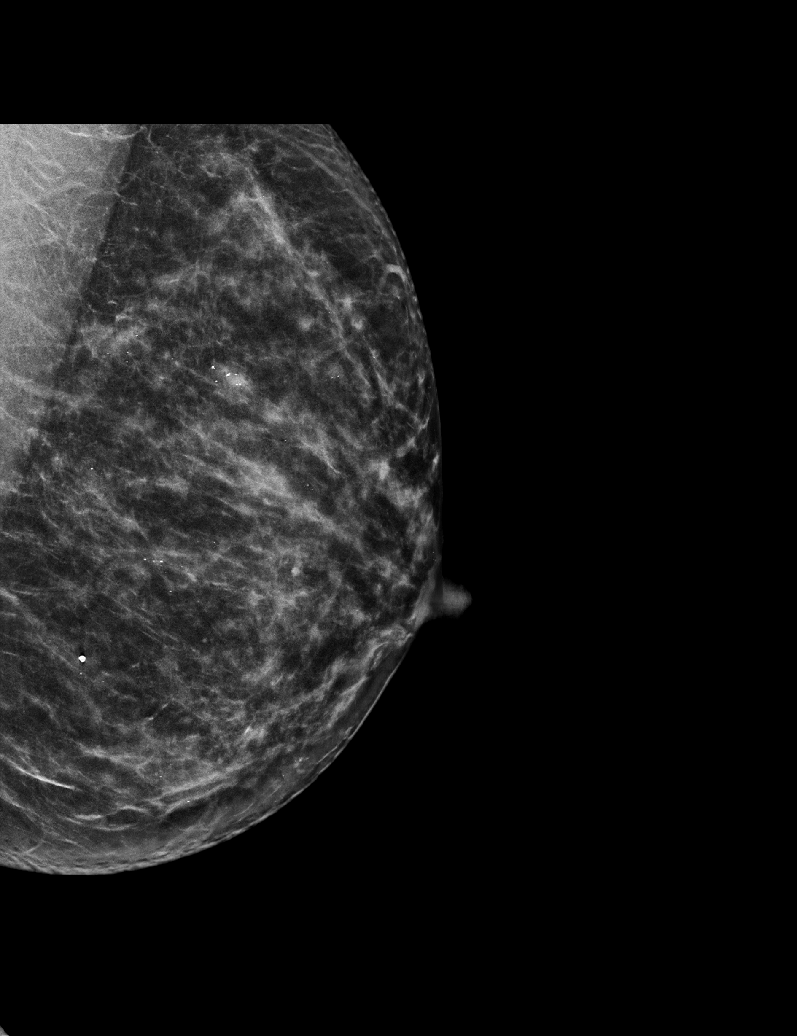

[L ML tomo · tomo slice 33/64.0]
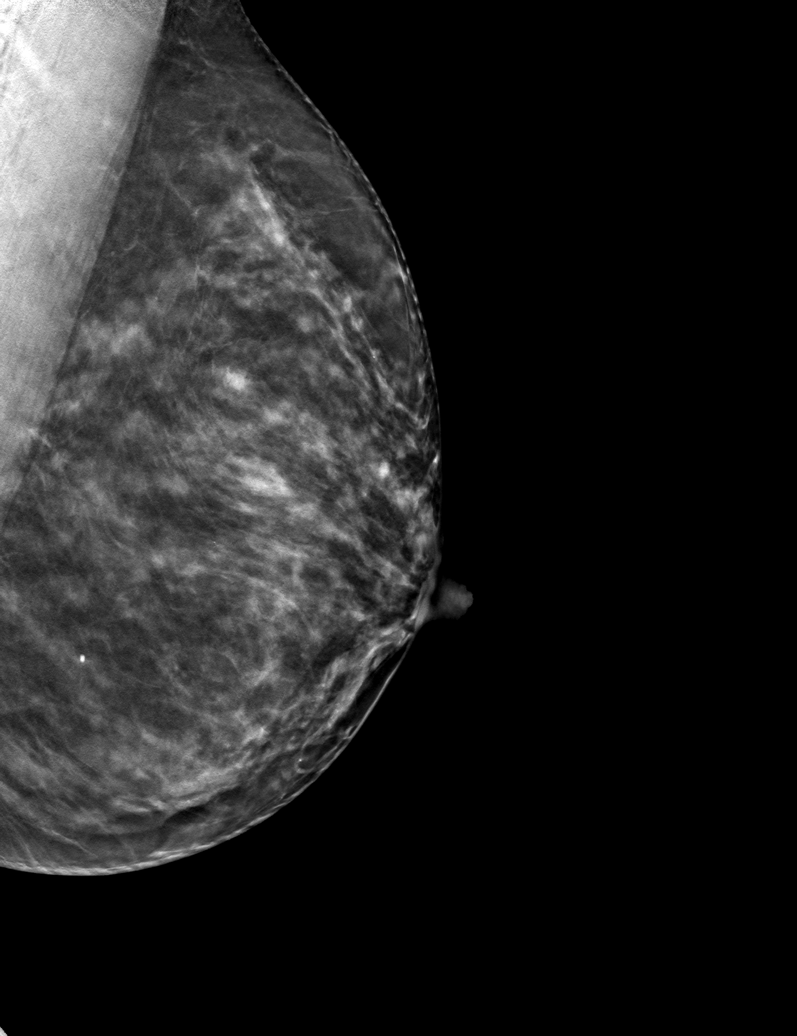

[L MLO tomo · tomo slice 29/56.0]
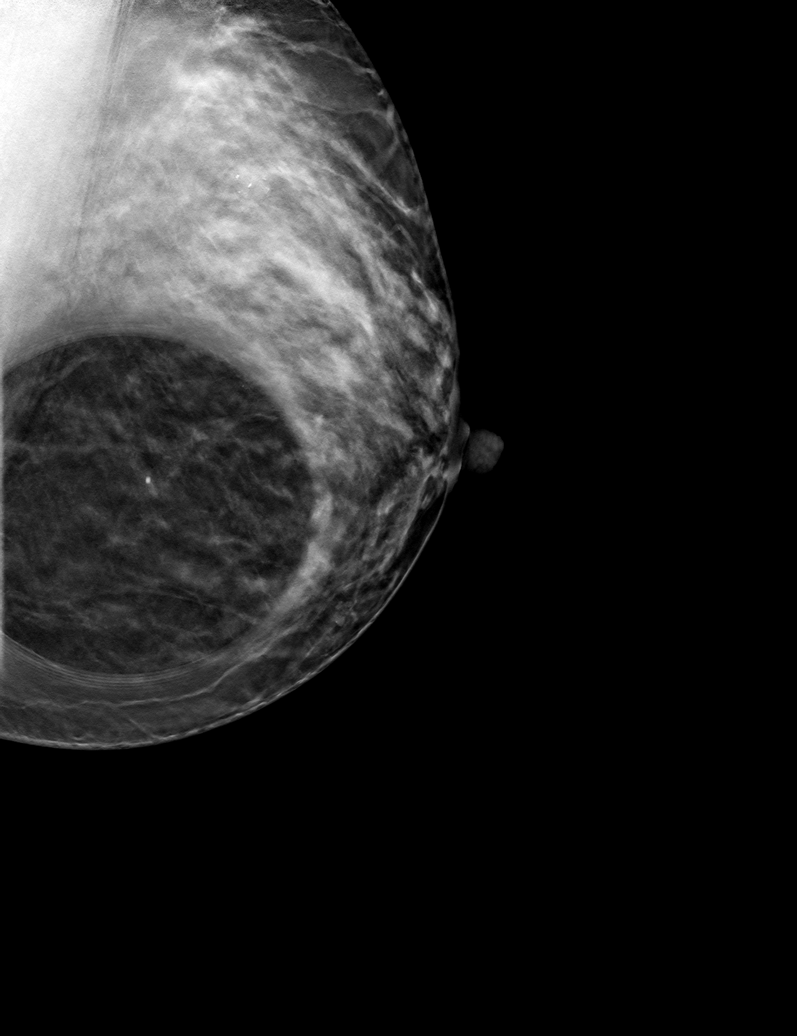

[6 of 14 positions shown; findings below may reference images not displayed]

ACR Breast Density Category b: There are scattered areas of
fibroglandular density.
FINDINGS: On today's additional diagnostic views, there is no persistent
asymmetry within the LEFT breast indicating superimposition of
normal dense fibroglandular tissues. There are no new dominant
masses, suspicious calcifications or secondary signs of malignancy
identified in the LEFT breast on today's exam.

Mammographic images were processed with CAD.
IMPRESSION: No evidence of malignancy. Patient may return to routine annual
bilateral screening mammogram schedule.

RECOMMENDATION:
Screening mammogram in one year.(Code:[id number])

I have discussed the findings and recommendations with the patient.
If applicable, a reminder letter will be sent to the patient
regarding the next appointment.

BI-RADS CATEGORY  2: Benign.

## 2020-08-07 ENCOUNTER — Ambulatory Visit: Payer: 59 | Admitting: Family Medicine

## 2020-08-07 NOTE — Progress Notes (Deleted)
    SUBJECTIVE:   Chief compliant/HPI: annual examination  Zoe Heath is a 50 y.o. who presents today for an annual exam.    History tabs reviewed and updated ***.   Review of systems form reviewed and notable for ***.   OBJECTIVE:   There were no vitals taken for this visit.  ***  ASSESSMENT/PLAN:   No problem-specific Assessment & Plan notes found for this encounter.    Annual Examination  See AVS for age appropriate recommendations  PHQ score ***, reviewed and discussed.  BP reviewed and at goal ***.  Asked about intimate partner violence and resources given as appropriate  Advance directives discussion ***  Considered the following items based upon USPSTF recommendations: Diabetes screening: {discussed/ordered:14545} Screening for elevated cholesterol: {discussed/ordered:14545} HIV testing: {discussed/ordered:14545} Hepatitis C: {discussed/ordered:14545} Hepatitis B: {discussed/ordered:14545} Syphilis if at high risk: {discussed/ordered:14545} GC/CT {GC/CT screening :23818} Osteoporosis screening considered based upon risk of fracture from [facility] calculator. Major osteoporotic fracture risk is ***%. DEXA {ordered not order:23822}.  Reviewed risk factors for latent tuberculosis and {not indicated/requested/declined:14582}   Discussed family history, BRCA testing {not indicated/requested/declined:14582}. Tool used to risk stratify was ***.  Cervical cancer screening: {PAPTYPE:23819} Breast cancer screening: {mammoscreen:23820} Colorectal cancer screening: {crcscreen:23821::"discussed, colonoscopy ordered"} Lung cancer screening: {discussed/declined/written info:19698}. See documentation below regarding indications/risks/benefits.  Vaccinations ***.   Follow up in 1 *** year or sooner if indicated.    Gerlene Fee, [place]

## 2020-08-28 ENCOUNTER — Other Ambulatory Visit: Payer: Self-pay

## 2020-08-28 ENCOUNTER — Ambulatory Visit (INDEPENDENT_AMBULATORY_CARE_PROVIDER_SITE_OTHER): Payer: 59 | Admitting: Family Medicine

## 2020-08-28 ENCOUNTER — Encounter: Payer: Self-pay | Admitting: Family Medicine

## 2020-08-28 VITALS — BP 120/70 | HR 67 | Ht 69.0 in | Wt 157.8 lb

## 2020-08-28 DIAGNOSIS — Z0289 Encounter for other administrative examinations: Secondary | ICD-10-CM

## 2020-08-28 NOTE — Progress Notes (Signed)
    SUBJECTIVE:   CHIEF COMPLAINT / HPI:   Patient initially presented for physical exam.  Was called by OB/GYN office to complete physical exam.  Because she is established here she wanted to come for her physical.  No concerns today was unaware that physical is due in August or thereafter.  Signed records release for updated mammogram.  OBJECTIVE:   BP 120/70   Pulse 67   Ht 5\' 9"  (1.753 m)   Wt 157 lb 12.8 oz (71.6 kg)   SpO2 99%   BMI 23.30 kg/m   General: Appears well, no acute distress. Age appropriate. Respiratory: normal effort  ASSESSMENT/PLAN:   No problem-specific Assessment & Plan notes found for this encounter. 1. Encounter for completion of form with patient Signed record release to get updated mammogram from OB/GYN office. - Will follow-up in August for physical exam    Gerlene Fee, [place]

## 2021-03-20 ENCOUNTER — Encounter: Payer: Self-pay | Admitting: Family Medicine

## 2021-03-20 ENCOUNTER — Other Ambulatory Visit: Payer: Self-pay

## 2021-03-20 ENCOUNTER — Ambulatory Visit (INDEPENDENT_AMBULATORY_CARE_PROVIDER_SITE_OTHER): Payer: 59 | Admitting: Family Medicine

## 2021-03-20 VITALS — BP 139/86 | HR 73 | Ht 69.0 in | Wt 160.0 lb

## 2021-03-20 DIAGNOSIS — R030 Elevated blood-pressure reading, without diagnosis of hypertension: Secondary | ICD-10-CM | POA: Diagnosis not present

## 2021-03-20 DIAGNOSIS — Z23 Encounter for immunization: Secondary | ICD-10-CM

## 2021-03-20 DIAGNOSIS — Z833 Family history of diabetes mellitus: Secondary | ICD-10-CM | POA: Diagnosis not present

## 2021-03-20 DIAGNOSIS — R7303 Prediabetes: Secondary | ICD-10-CM | POA: Diagnosis not present

## 2021-03-20 DIAGNOSIS — Z8639 Personal history of other endocrine, nutritional and metabolic disease: Secondary | ICD-10-CM

## 2021-03-20 DIAGNOSIS — Z0000 Encounter for general adult medical examination without abnormal findings: Secondary | ICD-10-CM | POA: Diagnosis not present

## 2021-03-20 LAB — POCT GLYCOSYLATED HEMOGLOBIN (HGB A1C): Hemoglobin A1C: 5.9 % — AB (ref 4.0–5.6)

## 2021-03-20 NOTE — Patient Instructions (Signed)
Preventive Care 50 Years Old, Female, Female, Female °Preventive care refers to lifestyle choices and visits with your health care provider that can promote health and wellness. Preventive care visits are also called wellness exams. °What can I expect for my preventive care visit? °Counseling °Your health care provider may ask you questions about your: °Medical history, including: °Past medical problems. °Family medical history. °Pregnancy history. °Current health, including: °Menstrual cycle. °Method of birth control. °Emotional well-being. °Home life and relationship well-being. °Sexual activity and sexual health. °Lifestyle, including: °Alcohol, nicotine or tobacco, and drug use. °Access to firearms. °Diet, exercise, and sleep habits. °Work and work environment. °Sunscreen use. °Safety issues such as seatbelt and bike helmet use. °Physical exam °Your health care provider will check your: °Height and weight. These may be used to calculate your BMI (body mass index). BMI is a measurement that tells if you are at a healthy weight. °Waist circumference. This measures the distance around your waistline. This measurement also tells if you are at a healthy weight and may help predict your risk of certain diseases, such as type 2 diabetes and high blood pressure. °Heart rate and blood pressure. °Body temperature. °Skin for abnormal spots. °What immunizations do I need? °Vaccines are usually given at various ages, according to a schedule. Your health care provider will recommend vaccines for you based on your age, medical history, and lifestyle or other factors, such as travel or where you work. °What tests do I need? °Screening °Your health care provider may recommend screening tests for certain conditions. This may include: °Lipid and cholesterol levels. °Diabetes screening. This is done by checking your blood sugar (glucose) after you have not eaten for a while (fasting). °Pelvic exam and Pap test. °Hepatitis B test. °Hepatitis C  test. °HIV (human immunodeficiency virus) test. °STI (sexually transmitted infection) testing, if you are at risk. °Lung cancer screening. °Colorectal cancer screening. °Mammogram. Talk with your health care provider about when you should start having regular mammograms. This may depend on whether you have a family history of breast cancer. °BRCA-related cancer screening. This may be done if you have a family history of breast, ovarian, tubal, or peritoneal cancers. °Bone density scan. This is done to screen for osteoporosis. °Talk with your health care provider about your test results, treatment options, and if necessary, the need for more tests. °Follow these instructions at home: °Eating and drinking ° °Eat a diet that includes fresh fruits and vegetables, whole grains, lean protein, and low-fat dairy products. °Take vitamin and mineral supplements as recommended by your health care provider. °Do not drink alcohol if: °Your health care provider tells you not to drink. °You are pregnant, may be pregnant, or are planning to become pregnant. °If you drink alcohol: °Limit how much you have to 0-1 drink a day. °Know how much alcohol is in your drink. In the [place] one drink equals one 12 oz bottle of beer (355 mL), one 5 oz glass of wine (148 mL), or one 1½ oz glass of hard liquor (44 mL). °Lifestyle °Brush your teeth every morning and night with fluoride toothpaste. Floss one time each day. °Exercise for at least 30 minutes 5 or more days each week. °Do not use any products that contain nicotine or tobacco. These products include cigarettes, chewing tobacco, and vaping devices, such as e-cigarettes. If you need help quitting, ask your health care provider. °Do not use drugs. °If you are sexually active, practice safe sex. Use a condom or other form of protection to prevent   STIs. °If you do not wish to become pregnant, use a form of birth control. If you plan to become pregnant, see your health care provider for a  prepregnancy visit. °Take aspirin only as told by your health care provider. Make sure that you understand how much to take and what form to take. Work with your health care provider to find out whether it is safe and beneficial for you to take aspirin daily. °Find healthy ways to manage stress, such as: °Meditation, yoga, or listening to music. °Journaling. °Talking to a trusted person. °Spending time with friends and family. °Minimize exposure to UV radiation to reduce your risk of skin cancer. °Safety °Always wear your seat belt while driving or riding in a vehicle. °Do not drive: °If you have been drinking alcohol. Do not ride with someone who has been drinking. °When you are tired or distracted. °While texting. °If you have been using any mind-altering substances or drugs. °Wear a helmet and other protective equipment during sports activities. °If you have firearms in your house, make sure you follow all gun safety procedures. °Seek help if you have been physically or sexually abused. °What's next? °Visit your health care provider once a year for an annual wellness visit. °Ask your health care provider how often you should have your eyes and teeth checked. °Stay up to date on all vaccines. °This information is not intended to replace advice given to you by your health care provider. Make sure you discuss any questions you have with your health care provider. °Document Revised: 09/19/2020 Document Reviewed: 09/19/2020 °Elsevier Patient Education © 2022 [employer]. ° °

## 2021-03-20 NOTE — Progress Notes (Deleted)
° ° °  SUBJECTIVE:   Chief compliant/HPI: annual examination  Zoe Heath is a 50 y.o. who presents today for an annual exam.    History tabs reviewed and updated ***.   Review of systems form reviewed and notable for ***.   OBJECTIVE:   BP 139/86    Pulse 73    Ht _0  (1.753 m)    Wt 160 lb (72.6 kg)    SpO2 100%    BMI 23.63 kg/m   ***  ASSESSMENT/PLAN:   No problem-specific Assessment & Plan notes found for this encounter.    Annual Examination  See AVS for age appropriate recommendations  PHQ score ***, reviewed and discussed.  BP reviewed and at goal ***.  Asked about intimate partner violence and resources given as appropriate  Advance directives discussion ***  Considered the following items based upon USPSTF recommendations: Diabetes screening: {discussed/ordered:14545} Screening for elevated cholesterol: {discussed/ordered:14545} HIV testing: {discussed/ordered:14545} Hepatitis C: {discussed/ordered:14545} Hepatitis B: {discussed/ordered:14545} Syphilis if at high risk: {discussed/ordered:14545} GC/CT {GC/CT screening :23818} Osteoporosis screening considered based upon risk of fracture from [facility] calculator. Major osteoporotic fracture risk is ***%. DEXA {ordered not order:23822}.  Reviewed risk factors for latent tuberculosis and {not indicated/requested/declined:14582}   Discussed family history, BRCA testing {not indicated/requested/declined:14582}. Tool used to risk stratify was ***.  Cervical cancer screening: {PAPTYPE:23819} Breast cancer screening: {mammoscreen:23820} Colorectal cancer screening: {crcscreen:23821::"discussed, colonoscopy ordered"} Lung cancer screening: {discussed/declined/written info:19698}. See documentation below regarding indications/risks/benefits.  Vaccinations ***.   Follow up in 1 *** year or sooner if indicated.    Gerlene Fee, [place]

## 2021-03-20 NOTE — Progress Notes (Signed)
SUBJECTIVE:  ° °Chief compliant/HPI: annual examination ° °Zoe Heath is a 50 y.o. who presents today for an annual exam.  ° °No concerns today. Has received flu vaccine this year; works for [employer].  ° °OBJECTIVE:  ° °BP 139/86    Pulse 73    Ht 5' 9" (1.753 m)    Wt 160 lb (72.6 kg)    SpO2 100%    BMI 23.63 kg/m²   °Flowsheet Row Office Visit from 08/28/2020 in [facility]  °PHQ-9 Total Score 0  ° °  ° ° °Physical Exam °Vitals reviewed.  °Constitutional:   °   General: She is not in acute distress. °   Appearance: She is not ill-appearing or toxic-appearing.  °HENT:  °   Head: Normocephalic.  °   Right Ear: External ear normal.  °   Left Ear: External ear normal.  °   Nose: Nose normal.  °Eyes:  °   Extraocular Movements: Extraocular movements intact.  °   Conjunctiva/sclera: Conjunctivae normal.  °   Pupils: Pupils are equal, round, and reactive to light.  °Cardiovascular:  °   Rate and Rhythm: Normal rate and regular rhythm.  °   Heart sounds: Normal heart sounds.  °Pulmonary:  °   Effort: Pulmonary effort is normal.  °   Breath sounds: Normal breath sounds.  °Abdominal:  °   General: Abdomen is flat. Bowel sounds are normal.  °   Palpations: Abdomen is soft.  °Musculoskeletal:  °   Cervical back: Neck supple.  °   Right lower leg: No edema.  °   Left lower leg: No edema.  °Lymphadenopathy:  °   Cervical: No cervical adenopathy.  °Neurological:  °   Mental Status: She is alert and oriented to person, place, and time.  °   Gait: Gait normal.  °Psychiatric:     °   Mood and Affect: Mood normal.     °   Behavior: Behavior normal.  ° ° ° °ASSESSMENT/PLAN:  ° °History of high cholesterol °Prior ASCVD 4.5%. Repeat and start statin in necessary.  °- Lipid Panel ° °Prediabetes °Prior A1c 5.8 °- HgB A1c ° °Elevated blood pressure reading °Asymptomatic. Repeat at follow up.  ° °Annual Examination  °See AVS for age appropriate recommendations  °PHQ score 0, reviewed  °BP reviewed and at  goal but elevated; continue to monitor at subsequent visits.  °Asked about intimate partner violence and resources given as appropriate  ° °Considered the following items based upon USPSTF recommendations: °Diabetes screening: ordered °Screening for elevated cholesterol: ordered °HIV testing:  not indicated °Hepatitis C:  not indicated °Hepatitis B:  not indicated °Syphilis if at high risk:  not indicated °GC/CT not at high risk and not ordered. °Osteoporosis screening considered based upon risk of fracture from [employer] calculator. Major osteoporotic fracture risk is 3.7%. DEXA not ordered.  °Reviewed risk factors for latent tuberculosis and not indicated ° ° °Discussed family history, BRCA testing not indicated.  °Cervical cancer screening: prior Pap reviewed, repeat due in 2024 °Breast cancer screening:  prior reviewed repeat due this year °Colorectal cancer screening: up to date on screening for CRC. °Lung cancer screening:  not indicated . See documentation below regarding indications/risks/benefits.  °Vaccinations n/a.  ° °Follow up in 1 year or sooner if indicated.  ° ° ° Autry-Lott, DO °[facility]  ° ° ° °

## 2021-03-21 ENCOUNTER — Ambulatory Visit (INDEPENDENT_AMBULATORY_CARE_PROVIDER_SITE_OTHER): Payer: 59

## 2021-03-21 DIAGNOSIS — Z23 Encounter for immunization: Secondary | ICD-10-CM

## 2021-03-21 LAB — LIPID PANEL
Chol/HDL Ratio: 5.9 ratio — ABNORMAL HIGH (ref 0.0–4.4)
Cholesterol, Total: 300 mg/dL — ABNORMAL HIGH (ref 100–199)
HDL: 51 mg/dL (ref >39)
LDL Chol Calc (NIH): 240 mg/dL — ABNORMAL HIGH (ref 0–99)
Triglycerides: 64 mg/dL (ref 0–149)
VLDL Cholesterol Cal: 9 mg/dL (ref 5–40)

## 2021-03-26 ENCOUNTER — Telehealth: Payer: Self-pay | Admitting: Family Medicine

## 2021-03-26 NOTE — Telephone Encounter (Signed)
Attempted to call patient to discuss results. No answer and mailbox full. Need to discuss starting statin medication. Will send mychart message.   Gerlene Fee, DO 03/26/2021, 7:43 PM PGY-3, [place]

## 2021-05-29 ENCOUNTER — Telehealth: Payer: Self-pay

## 2021-05-29 ENCOUNTER — Encounter: Payer: Self-pay | Admitting: Family Medicine

## 2021-05-29 NOTE — Telephone Encounter (Signed)
Patient contacted to schedule mammogram.  ? ?RE: Mobile Mammo event located at: ? ?[facility]  [facility]  ?      ?[address]    ?[place] [address]    ? ?Date: April 7th  ? ? ?

## 2021-06-20 NOTE — Progress Notes (Signed)
? ? ?  SUBJECTIVE:  ? ?CHIEF COMPLAINT / HPI:  ? ?Hypertension ?Here for BP check of previously elevated blood pressure reading.  ?- Medications: n/a; no prior diagnosis of HTN ?- Checking BP at home: No ?- Denies any SOB, CP, vision changes, LE edema, medication SEs, or symptoms of hypotension ?- Diet: Admits to recently seeking lower salt options.  ? ?PERTINENT  PMH / PSH: HLD ? ?OBJECTIVE:  ? ?BP (!) 150/96   Pulse 77   Wt 157 lb 3.2 oz (71.3 kg)   SpO2 100%   BMI 23.21 kg/m?   ?Physical Exam ?Vitals reviewed.  ?Constitutional:   ?   General: She is not in acute distress. ?   Appearance: She is not ill-appearing, toxic-appearing or diaphoretic.  ?Cardiovascular:  ?   Rate and Rhythm: Normal rate and regular rhythm.  ?Pulmonary:  ?   Effort: Pulmonary effort is normal.  ?   Breath sounds: Normal breath sounds.  ?Neurological:  ?   Mental Status: She is alert and oriented to person, place, and time.  ?Psychiatric:     ?   Mood and Affect: Mood normal.     ?   Behavior: Behavior normal.  ? ?ASSESSMENT/PLAN:  ? ?Hypertension, unspecified type ?Elevated on 2 separate office visits and upon repeat today. Asymptomatic. Will start amlodipine 10 mg daily. Follow up in 2 weeks for repeat blood pressure check. Consider adding HCTZ if 2nd agent needed.  ? ?Of note, last LDL 240 (03/20/2021). Needs statin medication. Will discuss at follow up.  ? ?Gerlene Fee, DO ?[place]  ?

## 2021-06-20 NOTE — Patient Instructions (Addendum)
It was wonderful to see you today. ? ?Please bring ALL of your medications with you to every visit.  ? ?Today we talked about: ? ?Elevated blood pressure. We started amlodipine. Follow up in 2 weeks.  ? ?Please be sure to schedule follow up at the front  desk before you leave today.  ? ?If you haven't already, sign up for My Chart to have easy access to your labs results, and communication with your primary care physician. ? ?Please call the clinic at [phone] if your symptoms worsen or you have any concerns. It was our pleasure to serve you. ? ?Dr. Janus Molder ? ?DASH Eating Plan ?DASH stands for Dietary Approaches to Stop Hypertension. The DASH eating plan is a healthy eating plan that has been shown to: ?Reduce high blood pressure (hypertension). ?Reduce your risk for type 2 diabetes, heart disease, and stroke. ?Help with weight loss. ?What are tips for following this plan? ?Reading food labels ?Check food labels for the amount of salt (sodium) per serving. Choose foods with less than 5 percent of the Daily Value of sodium. Generally, foods with less than 300 milligrams (mg) of sodium per serving fit into this eating plan. ?To find whole grains, look for the word "whole" as the first word in the ingredient list. ?Shopping ?Buy products labeled as "low-sodium" or "no salt added." ?Buy fresh foods. Avoid canned foods and pre-made or frozen meals. ?Cooking ?Avoid adding salt when cooking. Use salt-free seasonings or herbs instead of table salt or sea salt. Check with your health care provider or pharmacist before using salt substitutes. ?Do not fry foods. Cook foods using healthy methods such as baking, boiling, grilling, roasting, and broiling instead. ?Cook with heart-healthy oils, such as olive, canola, avocado, soybean, or sunflower oil. ?Meal planning ? ?Eat a balanced diet that includes: ?4 or more servings of fruits and 4 or more servings of vegetables each day. Try to fill one-half of your plate with  fruits and vegetables. ?6-8 servings of whole grains each day. ?Less than 6 oz (170 g) of lean meat, poultry, or fish each day. A 3-oz (85-g) serving of meat is about the same size as a deck of cards. One egg equals 1 oz (28 g). ?2-3 servings of low-fat dairy each day. One serving is 1 cup (237 mL). ?1 serving of nuts, seeds, or beans 5 times each week. ?2-3 servings of heart-healthy fats. Healthy fats called omega-3 fatty acids are found in foods such as walnuts, flaxseeds, fortified milks, and eggs. These fats are also found in cold-water fish, such as sardines, salmon, and mackerel. ?Limit how much you eat of: ?Canned or prepackaged foods. ?Food that is high in trans fat, such as some fried foods. ?Food that is high in saturated fat, such as fatty meat. ?Desserts and other sweets, sugary drinks, and other foods with added sugar. ?Full-fat dairy products. ?Do not salt foods before eating. ?Do not eat more than 4 egg yolks a week. ?Try to eat at least 2 vegetarian meals a week. ?Eat more home-cooked food and less restaurant, buffet, and fast food. ?Lifestyle ?When eating at a restaurant, ask that your food be prepared with less salt or no salt, if possible. ?If you drink alcohol: ?Limit how much you use to: ?0-1 drink a day for women who are not pregnant. ?0-2 drinks a day for men. ?Be aware of how much alcohol is in your drink. In the [place] one drink equals one 12 oz bottle of beer (355 mL),  one 5 oz glass of wine (148 mL), or one 1? oz glass of hard liquor (44 mL). ?General information ?Avoid eating more than 2,300 mg of salt a day. If you have hypertension, you may need to reduce your sodium intake to 1,500 mg a day. ?Work with your health care provider to maintain a healthy body weight or to lose weight. Ask what an ideal weight is for you. ?Get at least 30 minutes of exercise that causes your heart to beat faster (aerobic exercise) most days of the week. Activities may include walking, swimming, or  biking. ?Work with your health care provider or dietitian to adjust your eating plan to your individual calorie needs. ?What foods should I eat? ?Fruits ?All fresh, dried, or frozen fruit. Canned fruit in natural juice (without added sugar). ?Vegetables ?Fresh or frozen vegetables (raw, steamed, roasted, or grilled). Low-sodium or reduced-sodium tomato and vegetable juice. Low-sodium or reduced-sodium tomato sauce and tomato paste. Low-sodium or reduced-sodium canned vegetables. ?Grains ?Whole-grain or whole-wheat bread. Whole-grain or whole-wheat pasta. Brown rice. [place] Morrow. Bulgur. Whole-grain and low-sodium cereals. Pita bread. Low-fat, low-sodium crackers. Whole-wheat flour tortillas. ?Meats and other proteins ?Skinless chicken or [place]. Ground chicken or [place]. Pork with fat trimmed off. Fish and seafood. Egg whites. Dried beans, peas, or lentils. Unsalted nuts, nut butters, and seeds. Unsalted canned beans. Lean cuts of beef with fat trimmed off. Low-sodium, lean precooked or cured meat, such as sausages or meat loaves. ?Dairy ?Low-fat (1%) or fat-free (skim) milk. Reduced-fat, low-fat, or fat-free cheeses. Nonfat, low-sodium ricotta or cottage cheese. Low-fat or nonfat yogurt. Low-fat, low-sodium cheese. ?Fats and oils ?Soft margarine without trans fats. Vegetable oil. Reduced-fat, low-fat, or light mayonnaise and salad dressings (reduced-sodium). Canola, safflower, olive, avocado, soybean, and sunflower oils. Avocado. ?Seasonings and condiments ?Herbs. Spices. Seasoning mixes without salt. ?Other foods ?Unsalted popcorn and pretzels. Fat-free sweets. ?The items listed above may not be a complete list of foods and beverages you can eat. Contact a dietitian for more information. ?What foods should I avoid? ?Fruits ?Canned fruit in a light or heavy syrup. Fried fruit. Fruit in cream or butter sauce. ?Vegetables ?Creamed or fried vegetables. Vegetables in a cheese sauce. Regular canned vegetables (not  low-sodium or reduced-sodium). Regular canned tomato sauce and paste (not low-sodium or reduced-sodium). Regular tomato and vegetable juice (not low-sodium or reduced-sodium). Angie Fava. Olives. ?Grains ?Baked goods made with fat, such as croissants, muffins, or some breads. Dry pasta or rice meal packs. ?Meats and other proteins ?Fatty cuts of meat. Ribs. Fried meat. Berniece Salines. Bologna, salami, and other precooked or cured meats, such as sausages or meat loaves. Fat from the back of a pig (fatback). Bratwurst. Salted nuts and seeds. Canned beans with added salt. Canned or smoked fish. Whole eggs or egg yolks. Chicken or [place] with skin. ?Dairy ?Whole or 2% milk, cream, and half-and-half. Whole or full-fat cream cheese. Whole-fat or sweetened yogurt. Full-fat cheese. Nondairy creamers. Whipped toppings. Processed cheese and cheese spreads. ?Fats and oils ?Butter. Stick margarine. Lard. Shortening. Ghee. Bacon fat. Tropical oils, such as coconut, palm kernel, or palm oil. ?Seasonings and condiments ?Onion salt, garlic salt, seasoned salt, table salt, and sea salt. Worcestershire sauce. Tartar sauce. Barbecue sauce. Teriyaki sauce. Soy sauce, including reduced-sodium. Steak sauce. Canned and packaged gravies. Fish sauce. Oyster sauce. Cocktail sauce. Store-bought horseradish. Ketchup. Mustard. Meat flavorings and tenderizers. Bouillon cubes. Hot sauces. Pre-made or packaged marinades. Pre-made or packaged taco seasonings. Relishes. Regular salad dressings. ?Other foods ?Salted popcorn and pretzels. ?The  items listed above may not be a complete list of foods and beverages you should avoid. Contact a dietitian for more information. ?Where to find more information ?National Heart, Lung, and Blood Institute: https://wilson-eaton.com/ ?American Heart Association: www.heart.org ?Academy of Nutrition and Dietetics: www.eatright.org ?[place]: www.kidney.org ?Summary ?The DASH eating plan is a healthy eating plan that  has been shown to reduce high blood pressure (hypertension). It may also reduce your risk for type 2 diabetes, heart disease, and stroke. ?When on the DASH eating plan, aim to eat more fresh fruits and vegetables, who

## 2021-06-21 ENCOUNTER — Encounter: Payer: Self-pay | Admitting: Family Medicine

## 2021-06-21 ENCOUNTER — Other Ambulatory Visit: Payer: Self-pay

## 2021-06-21 ENCOUNTER — Other Ambulatory Visit (HOSPITAL_COMMUNITY): Payer: Self-pay

## 2021-06-21 ENCOUNTER — Ambulatory Visit: Payer: 59 | Admitting: Family Medicine

## 2021-06-21 VITALS — BP 146/101 | HR 77 | Wt 157.2 lb

## 2021-06-21 DIAGNOSIS — I10 Essential (primary) hypertension: Secondary | ICD-10-CM

## 2021-06-21 MED ORDER — AMLODIPINE BESYLATE 10 MG PO TABS
10.0000 mg | ORAL_TABLET | Freq: Every day | ORAL | 3 refills | Status: DC
  Filled 2021-06-21: qty 90, 90d supply, fill #0

## 2021-06-24 DIAGNOSIS — I10 Essential (primary) hypertension: Secondary | ICD-10-CM | POA: Insufficient documentation

## 2021-07-08 ENCOUNTER — Ambulatory Visit: Payer: 59 | Admitting: Family Medicine

## 2021-07-18 ENCOUNTER — Ambulatory Visit: Payer: 59 | Admitting: Family Medicine

## 2021-09-10 ENCOUNTER — Encounter: Payer: Self-pay | Admitting: *Deleted

## 2022-03-13 ENCOUNTER — Other Ambulatory Visit: Payer: Self-pay | Admitting: Obstetrics and Gynecology

## 2022-03-13 DIAGNOSIS — Z1231 Encounter for screening mammogram for malignant neoplasm of breast: Secondary | ICD-10-CM

## 2022-05-07 ENCOUNTER — Ambulatory Visit
Admission: RE | Admit: 2022-05-07 | Discharge: 2022-05-07 | Disposition: A | Discharge status: Home / Self Care | Payer: Commercial Managed Care - PPO | Source: Ambulatory Visit | Attending: Obstetrics and Gynecology | Admitting: Obstetrics and Gynecology

## 2022-05-07 DIAGNOSIS — Z1231 Encounter for screening mammogram for malignant neoplasm of breast: Secondary | ICD-10-CM | POA: Diagnosis not present

## 2023-07-21 ENCOUNTER — Ambulatory Visit: Payer: Commercial Managed Care - PPO | Admitting: Physician Assistant

## 2023-08-11 ENCOUNTER — Encounter: Payer: Self-pay | Admitting: Physician Assistant

## 2023-08-11 ENCOUNTER — Ambulatory Visit: Admitting: Physician Assistant

## 2023-08-11 ENCOUNTER — Other Ambulatory Visit (HOSPITAL_COMMUNITY): Payer: Self-pay

## 2023-08-11 VITALS — BP 160/90 | HR 78 | Temp 97.3°F | Ht 69.0 in | Wt 168.2 lb

## 2023-08-11 DIAGNOSIS — G8929 Other chronic pain: Secondary | ICD-10-CM | POA: Diagnosis not present

## 2023-08-11 DIAGNOSIS — M25561 Pain in right knee: Secondary | ICD-10-CM

## 2023-08-11 DIAGNOSIS — E88819 Insulin resistance, unspecified: Secondary | ICD-10-CM | POA: Diagnosis not present

## 2023-08-11 DIAGNOSIS — I10 Essential (primary) hypertension: Secondary | ICD-10-CM

## 2023-08-11 DIAGNOSIS — E785 Hyperlipidemia, unspecified: Secondary | ICD-10-CM | POA: Diagnosis not present

## 2023-08-11 DIAGNOSIS — Z30432 Encounter for removal of intrauterine contraceptive device: Secondary | ICD-10-CM

## 2023-08-11 DIAGNOSIS — Z23 Encounter for immunization: Secondary | ICD-10-CM

## 2023-08-11 MED ORDER — AMLODIPINE BESYLATE 5 MG PO TABS
ORAL_TABLET | ORAL | 1 refills | Status: DC
  Filled 2023-08-11: qty 60, 30d supply, fill #0

## 2023-08-11 MED ORDER — BLOOD PRESSURE MONITOR MISC
1.0000 | Freq: Once | 0 refills | Status: AC
  Filled 2023-08-11: qty 1, 30d supply, fill #0

## 2023-08-11 NOTE — Addendum Note (Signed)
 Addended by: Winona Haw on: 08/11/2023 11:46 AM   Modules accepted: Orders

## 2023-08-11 NOTE — Progress Notes (Signed)
 Subjective:    Zoe Heath is a 53 y.o. female and is here to establish care. HPI  Health Maintenance Due  Topic Date Due   DTaP/Tdap/Td (2 - Td or Tdap) 07/09/2022   Acute Concerns: Joint pain Pt complains of intermittent right knee pain.  She reports it "bucks" at times. She believes it may possibly be arthritis.  Denies any weakness or pain that limits her daily activities.  Chronic Issues: Hypertension Pt was prescribed Amlodipine  10 mg once daily by her former PCP, which she has not started.  She notes she is not good at taking medications, but plans to become compliant with her antihypertensives moving forward.  She has not been monitoring her blood pressures at home.  Denies: chest pain, shortness of breath, lower extremity swelling  Hyperlipidemia Not currently taking any medications.  Has been managing with diet and exercise.  Per EHR, her cholesterol has worsened 3 years ago to most recent labs 2 years.  She has never been on a statin Her parents both have HLD  IUD // Possible Menopause Pt currently has Mirena  IUD placed.  When her first IUD was placed she did have break through bleeding, but she has not had break through bleeding with her more recent IUD placements.  She reports episodes of temperature fluctuating from hot and cold about 1-2 years ago.  She endorses hot flashes and diaphoresis while at rest.  Given she has an IUD placed, she is uncertain of whether or not she has peri-menopause/menopause.   Health Maintenance: Immunizations -- UTD; tetanus shot given today.  Colonoscopy -- UTD, last done 09/09/2019. Polyp resected and retrieved. Otherwise normal. Repeat in 10 years for repeat surveillance, next due 09/2029.  Mammogram -- Overdue, last done 05/07/22. No mammographic evidence of malignancy.  PAP -- Overdue since 06/13/2022. Last done 06/13/2019 at [facility]. Results were NILM.  Bone Density -- N/a Diet -- Healthy overall.  Exercise -- unk.    Sleep habits -- No concerns.  Mood -- Stable  Weight history: Wt Readings from Last 10 Encounters:  08/11/23 168 lb 4 oz (76.3 kg)  06/21/21 157 lb 3.2 oz (71.3 kg)  03/20/21 160 lb (72.6 kg)  08/28/20 157 lb 12.8 oz (71.6 kg)  12/06/19 159 lb 9.6 oz (72.4 kg)  06/30/15 145 lb (65.8 kg)  07/06/12 157 lb 6 oz (71.4 kg)  12/30/11 124 lb (56.2 kg)  12/06/10 133 lb (60.3 kg)   Body mass index is 24.85 kg/m. No LMP recorded. (Menstrual status: IUD).  Alcohol use:  reports no history of alcohol use.  Tobacco use:  Tobacco Use: Low Risk  (08/11/2023)   Patient History    Smoking Tobacco Use: Never    Smokeless Tobacco Use: Never    Passive Exposure: Not on file   Eligible for lung cancer screening? no     08/11/2023   10:15 AM  Depression screen PHQ 2/9  Decreased Interest 0  Down, Depressed, Hopeless 0  PHQ - 2 Score 0     Other providers/specialists: Patient Care Team: Alexander Iba, [place] as PCP - General (Physician Assistant)    PMHx, SurgHx, SocialHx, Medications, and Allergies were reviewed in the Visit Navigator and updated as appropriate.   Past Medical History:  Diagnosis Date   Hyperlipidemia    Vaginal delivery    2005, 2014    History reviewed. No pertinent surgical history.   Family History  Problem Relation Age of Onset   Thyroid  disease Mother  Hypertension Mother    Hypercholesterolemia Mother    Diabetes Father    Hypertension Father    Prostate cancer Father    Diabetes Paternal Grandmother    Diabetes Paternal Grandfather    Colon polyps Paternal Aunt     Social History   Tobacco Use   Smoking status: Never   Smokeless tobacco: Never  Vaping Use   Vaping status: Never Used  Substance Use Topics   Alcohol use: No   Drug use: No    Review of Systems:   ROS - Negative unless otherwise specified per HPI.   Objective:   BP (!) 170/100 (BP Location: Left Arm, Patient Position: Sitting, Cuff Size: Normal)   Pulse 78    Temp (!) 97.3 F (36.3 C) (Temporal)   Ht 5\' 9"  (1.753 m)   Wt 168 lb 4 oz (76.3 kg)   SpO2 98%   Breastfeeding No   BMI 24.85 kg/m  Body mass index is 24.85 kg/m.   Physical Exam Vitals and nursing note reviewed.  Constitutional:      General: She is not in acute distress.    Appearance: She is well-developed. She is not ill-appearing or toxic-appearing.  Cardiovascular:     Rate and Rhythm: Normal rate and regular rhythm.     Pulses: Normal pulses.     Heart sounds: Normal heart sounds, S1 normal and S2 normal.  Pulmonary:     Effort: Pulmonary effort is normal.     Breath sounds: Normal breath sounds.  Skin:    General: Skin is warm and dry.  Neurological:     Mental Status: She is alert.     GCS: GCS eye subscore is 4. GCS verbal subscore is 5. GCS motor subscore is 6.  Psychiatric:        Speech: Speech normal.        Behavior: Behavior normal. Behavior is cooperative.      Assessment/Plan:   Primary hypertension Above goal today No evidence of end-organ damage on my exam Recommend patient monitor home blood pressure at least a few times weekly Start 5 mg amlodipine  daily x 2 weeks, then increase to 10 mg daily Send in blood pressure monitor If home monitoring shows consistent elevation, or any symptom(s) develop, recommend reach out to us  for further advice on next steps Follow up in 2 month(s), sooner if concerns  Encounter for removal of intrauterine contraceptive device (IUD) Referral for IUD removal  Insulin resistance Update Hemoglobin A1c and provide recommendations   Hyperlipidemia, unspecified hyperlipidemia type Update lipid panel  Discussed calcium score Due to significantly elevated cholesterol, I do feel she would benefit from statin -- she would like to have her blood work checked first  Chronic pain of right knee Denies any severe symptom(s) No major issues with walking or Activities of Daily Living (ADLs) Continue to monitor and if  worsens, reach out to advise on next steps   I, Bernita Bristle, acting as a Neurosurgeon for Alexander Iba, [place]., have documented all relevant documentation on the behalf of Alexander Iba, [place], as directed by   while in the presence of Alexander Iba, [place].  I, Alexander Iba, [place], have reviewed all documentation for this visit. The documentation on 08/11/23 for the exam, diagnosis, procedures, and orders are all accurate and complete.  Alexander Iba, PA-C [facility]

## 2023-08-11 NOTE — Patient Instructions (Addendum)
 It was great to see you!  Update blood work today  Please start checking blood pressure -- I'm sending in a machine to [employer]  I strongly recommend considering calcium score -- A coronary CT calcium scan is a computed tomography scan of the heart for the assessment of severity of coronary artery disease. Specifically, it looks for calcium deposits in atherosclerotic plaques in the coronary arteries that can narrow arteries and increase the risk of heart attack. This is $99 with [facility] currently. Let me know your thoughts.  Your blood pressure is elevated in our office today.  I recommend that you monitor this at home.  Your goal blood pressure should be around < 130/80, unless you are over 55 years old, your goal may be closer to 140-150/90. Please note if you have been given other goals from a cardiologist or other healthcare provider, please defer to their recommendations.  When preparing to take your blood pressure: Plan ahead. Don't smoke, drink caffeine or exercise within 30 minutes before taking your blood pressure. Empty your bladder. Don't take the measurement over clothes. Remove the clothing over the arm that will be used to measure blood pressure. You can use either arm unless otherwise told by a healthcare provider. Usually there is not a big difference between readings on them. Be still. Allow at least five minutes of quiet rest before measurements. Don't talk or use the phone. Sit correctly. Sit with your back straight and supported (on a dining chair, rather than a sofa). Your feet should be flat on the floor. Do not cross your legs. Support your arm on a flat surface. The middle of the cuff should be placed on the upper arm at heart level.  Measure at the same time of the day. Take multiple readings and record the results. Each time you measure, take two readings one minute apart. Record the results and bring in to your next office visit.  In order to know how  well the medication is working, I would like you to take your readings 1-2 hours after taking your blood pressure medication if possible. Take your blood pressure measurements and record 2-3 days per week.  If you get a high blood pressure reading: A single high reading is not an immediate cause for alarm. If you get a reading that is higher than normal, take your blood pressure a second time. Write down the results of both measurements. Check with your health care professional to see if there's a health concern or whether there may be problems with your monitor. If your blood pressure readings are suddenly higher than 180/120 mm Hg, wait at least one minute and test again. If your readings are still very high, contact your health care professional immediately. You could be having a hypertensive crisis. Call 911 if your blood pressure is higher than 180/120 mm Hg and if you are having new signs or symptoms that may include: Chest pain Shortness of breath Back pain Numbness Weakness Change in vision Difficulty speaking Confusion Dizziness Vomiting  Let's follow-up in 2 months for Comprehensive Physical Exam (CPE) preventive care annual visit, sooner if you have concerns.  Take care,  Alexander Iba PA-C

## 2023-08-18 ENCOUNTER — Other Ambulatory Visit (INDEPENDENT_AMBULATORY_CARE_PROVIDER_SITE_OTHER)

## 2023-08-18 DIAGNOSIS — E785 Hyperlipidemia, unspecified: Secondary | ICD-10-CM | POA: Diagnosis not present

## 2023-08-18 DIAGNOSIS — I10 Essential (primary) hypertension: Secondary | ICD-10-CM

## 2023-08-18 DIAGNOSIS — E88819 Insulin resistance, unspecified: Secondary | ICD-10-CM | POA: Diagnosis not present

## 2023-08-18 LAB — COMPREHENSIVE METABOLIC PANEL WITH GFR
ALT: 39 U/L — ABNORMAL HIGH (ref 0–35)
AST: 29 U/L (ref 0–37)
Albumin: 4.3 g/dL (ref 3.5–5.2)
Alkaline Phosphatase: 45 U/L (ref 39–117)
BUN: 12 mg/dL (ref 6–23)
CO2: 26 meq/L (ref 19–32)
Calcium: 9.4 mg/dL (ref 8.4–10.5)
Chloride: 101 meq/L (ref 96–112)
Creatinine, Ser: 0.86 mg/dL (ref 0.40–1.20)
GFR: 76.55 mL/min (ref >60.00)
Glucose, Bld: 102 mg/dL — ABNORMAL HIGH (ref 70–99)
Potassium: 5 meq/L (ref 3.5–5.1)
Sodium: 135 meq/L (ref 135–145)
Total Bilirubin: 0.3 mg/dL (ref 0.2–1.2)
Total Protein: 7.7 g/dL (ref 6.0–8.3)

## 2023-08-18 LAB — CBC WITH DIFFERENTIAL/PLATELET
Basophils Absolute: 0 10*3/uL (ref 0.0–0.1)
Basophils Relative: 0.6 % (ref 0.0–3.0)
Eosinophils Absolute: 0.1 10*3/uL (ref 0.0–0.7)
Eosinophils Relative: 1.2 % (ref 0.0–5.0)
HCT: 38 % (ref 36.0–46.0)
Hemoglobin: 12.4 g/dL (ref 12.0–15.0)
Lymphocytes Relative: 27.5 % (ref 12.0–46.0)
Lymphs Abs: 1.7 10*3/uL (ref 0.7–4.0)
MCHC: 32.6 g/dL (ref 30.0–36.0)
MCV: 96.6 fl (ref 78.0–100.0)
Monocytes Absolute: 0.5 10*3/uL (ref 0.1–1.0)
Monocytes Relative: 7.7 % (ref 3.0–12.0)
Neutro Abs: 4 10*3/uL (ref 1.4–7.7)
Neutrophils Relative %: 63 % (ref 43.0–77.0)
Platelets: 301 10*3/uL (ref 150.0–400.0)
RBC: 3.93 Mil/uL (ref 3.87–5.11)
RDW: 13.8 % (ref 11.5–15.5)
WBC: 6.3 10*3/uL (ref 4.0–10.5)

## 2023-08-18 LAB — LIPID PANEL
Cholesterol: 278 mg/dL — ABNORMAL HIGH (ref 0–200)
HDL: 55.2 mg/dL (ref >39.00)
LDL Cholesterol: 212 mg/dL — ABNORMAL HIGH (ref 0–99)
NonHDL: 222.63
Total CHOL/HDL Ratio: 5
Triglycerides: 54 mg/dL (ref 0.0–149.0)
VLDL: 10.8 mg/dL (ref 0.0–40.0)

## 2023-08-18 LAB — TSH: TSH: 2.24 u[IU]/mL (ref 0.35–5.50)

## 2023-08-18 LAB — HEMOGLOBIN A1C: Hgb A1c MFr Bld: 6.1 % (ref 4.6–6.5)

## 2023-08-19 ENCOUNTER — Other Ambulatory Visit (HOSPITAL_COMMUNITY): Payer: Self-pay

## 2023-08-19 ENCOUNTER — Ambulatory Visit: Payer: Self-pay | Admitting: Physician Assistant

## 2023-08-19 MED ORDER — ATORVASTATIN CALCIUM 20 MG PO TABS
20.0000 mg | ORAL_TABLET | Freq: Every day | ORAL | 3 refills | Status: AC
  Filled 2023-08-19: qty 90, 90d supply, fill #0

## 2023-09-08 DIAGNOSIS — Z124 Encounter for screening for malignant neoplasm of cervix: Secondary | ICD-10-CM | POA: Diagnosis not present

## 2023-09-08 DIAGNOSIS — Z01419 Encounter for gynecological examination (general) (routine) without abnormal findings: Secondary | ICD-10-CM | POA: Diagnosis not present

## 2023-09-10 LAB — HM PAP SMEAR: HPV, high-risk: NEGATIVE

## 2023-09-14 ENCOUNTER — Encounter: Payer: Self-pay | Admitting: Physician Assistant

## 2023-10-23 ENCOUNTER — Other Ambulatory Visit (HOSPITAL_COMMUNITY): Payer: Self-pay

## 2023-10-23 ENCOUNTER — Ambulatory Visit (INDEPENDENT_AMBULATORY_CARE_PROVIDER_SITE_OTHER): Admitting: Physician Assistant

## 2023-10-23 VITALS — BP 140/90 | HR 74 | Temp 97.3°F | Ht 69.0 in | Wt 167.5 lb

## 2023-10-23 DIAGNOSIS — R748 Abnormal levels of other serum enzymes: Secondary | ICD-10-CM | POA: Diagnosis not present

## 2023-10-23 DIAGNOSIS — E88819 Insulin resistance, unspecified: Secondary | ICD-10-CM

## 2023-10-23 DIAGNOSIS — I10 Essential (primary) hypertension: Secondary | ICD-10-CM

## 2023-10-23 DIAGNOSIS — D509 Iron deficiency anemia, unspecified: Secondary | ICD-10-CM | POA: Diagnosis not present

## 2023-10-23 DIAGNOSIS — Z0000 Encounter for general adult medical examination without abnormal findings: Secondary | ICD-10-CM | POA: Diagnosis not present

## 2023-10-23 DIAGNOSIS — E785 Hyperlipidemia, unspecified: Secondary | ICD-10-CM

## 2023-10-23 LAB — COMPREHENSIVE METABOLIC PANEL WITH GFR
ALT: 21 U/L (ref 0–35)
AST: 22 U/L (ref 0–37)
Albumin: 4.4 g/dL (ref 3.5–5.2)
Alkaline Phosphatase: 43 U/L (ref 39–117)
BUN: 12 mg/dL (ref 6–23)
CO2: 29 meq/L (ref 19–32)
Calcium: 9.4 mg/dL (ref 8.4–10.5)
Chloride: 100 meq/L (ref 96–112)
Creatinine, Ser: 0.77 mg/dL (ref 0.40–1.20)
GFR: 87.3 mL/min (ref >60.00)
Glucose, Bld: 94 mg/dL (ref 70–99)
Potassium: 3.6 meq/L (ref 3.5–5.1)
Sodium: 138 meq/L (ref 135–145)
Total Bilirubin: 0.4 mg/dL (ref 0.2–1.2)
Total Protein: 8 g/dL (ref 6.0–8.3)

## 2023-10-23 LAB — IBC + FERRITIN
Ferritin: 103.4 ng/mL (ref 10.0–291.0)
Iron: 56 ug/dL (ref 42–145)
Saturation Ratios: 16.9 % — ABNORMAL LOW (ref 20.0–50.0)
TIBC: 331.8 ug/dL (ref 250.0–450.0)
Transferrin: 237 mg/dL (ref 212.0–360.0)

## 2023-10-23 LAB — LIPID PANEL
Cholesterol: 223 mg/dL — ABNORMAL HIGH (ref 0–200)
HDL: 57.4 mg/dL (ref >39.00)
LDL Cholesterol: 155 mg/dL — ABNORMAL HIGH (ref 0–99)
NonHDL: 165.39
Total CHOL/HDL Ratio: 4
Triglycerides: 52 mg/dL (ref 0.0–149.0)
VLDL: 10.4 mg/dL (ref 0.0–40.0)

## 2023-10-23 LAB — CBC
HCT: 35.8 % — ABNORMAL LOW (ref 36.0–46.0)
Hemoglobin: 11.8 g/dL — ABNORMAL LOW (ref 12.0–15.0)
MCHC: 33.1 g/dL (ref 30.0–36.0)
MCV: 94.4 fl (ref 78.0–100.0)
Platelets: 300 K/uL (ref 150.0–400.0)
RBC: 3.8 Mil/uL — ABNORMAL LOW (ref 3.87–5.11)
RDW: 13.7 % (ref 11.5–15.5)
WBC: 5.2 K/uL (ref 4.0–10.5)

## 2023-10-23 MED ORDER — AMLODIPINE BESYLATE 10 MG PO TABS
10.0000 mg | ORAL_TABLET | Freq: Every day | ORAL | 3 refills | Status: AC
  Filled 2023-10-23: qty 90, 90d supply, fill #0

## 2023-10-23 NOTE — Progress Notes (Signed)
 Subjective:    Zoe Heath is a 53 y.o. female and is here for a comprehensive physical exam.  HPI  There are no preventive care reminders to display for this patient.   Acute Concerns: Iron levels  Patient is wondering is her iron levels are low as she has previously experienced similar symptoms  as her daughter is currently experiencing who is currently anemic.  She currently has an IUD placed which is due to be removed in 2028.  Patient states that she had heavy periods prior to the placement of her IUD and her cycles would typically last around 7 days.  She is requesting to check levels today.  Chronic Issues: Hyperlipidemia Patient reports compliance and good tolerance of atorvastatin  20 mg once daily.  Strong family history of high LDL. Will recheck this today. No complains.  HTN  BP is 140/90 but attributes that to being frustrated on her way here. Patient reports compliance and good tolerance of Amlodipine  10 mg daily.  At home readings range around 130s/80s. Patient denies chest pain, SOB, blurred vision, dizziness, unusual headaches, lower leg swelling. Patient is compliant with medication. Denies excessive caffeine intake, stimulant usage, excessive alcohol intake, or increase in salt.  No further concerns.  Pre-diabetic  Previous levels were in the pre-diabetes range   Currently managed with diet and exercise.   Lab Results  Component Value Date   HGBA1C 6.1 08/18/2023    Health Maintenance: Immunizations -- UTD Colonoscopy -- last done on 09/09/19. Due in 2031 Mammogram -- Last done on 05/07/22. Normal -- overdue - discussed PAP -- Last done on 09/10/23. Normal  Bone Density -- N/A Diet -- follows a well balanced diet, attempts to increase water intake and reduce sodas Exercise -- attempts to increase exercise levels, moderate levels  Sleep habits-- low sleep during the week but tends to make up during the weekend. Mood -- stable   UTD with dentist? -  yes UTD with eye doctor? - yes  Weight history: Wt Readings from Last 10 Encounters:  10/23/23 167 lb 8 oz (76 kg)  08/11/23 168 lb 4 oz (76.3 kg)  06/21/21 157 lb 3.2 oz (71.3 kg)  03/20/21 160 lb (72.6 kg)  08/28/20 157 lb 12.8 oz (71.6 kg)  12/06/19 159 lb 9.6 oz (72.4 kg)  06/30/15 145 lb (65.8 kg)  07/06/12 157 lb 6 oz (71.4 kg)  12/30/11 124 lb (56.2 kg)  12/06/10 133 lb (60.3 kg)   Body mass index is 24.74 kg/m. No LMP recorded. (Menstrual status: IUD).  Alcohol use:  reports no history of alcohol use.  Tobacco use:  Tobacco Use: Low Risk  (10/23/2023)   Patient History    Smoking Tobacco Use: Never    Smokeless Tobacco Use: Never    Passive Exposure: Not on file   Eligible for lung cancer screening? no     10/23/2023   10:33 AM  Depression screen PHQ 2/9  Decreased Interest 0  Down, Depressed, Hopeless 0  PHQ - 2 Score 0     Other providers/specialists: Patient Care Team: Job Lukes, [place] as PCP - General (Physician Assistant)    PMHx, SurgHx, SocialHx, Medications, and Allergies were reviewed in the Visit Navigator and updated as appropriate.   Past Medical History:  Diagnosis Date   Allergy 06/1978   Hyperlipidemia    Vaginal delivery    2005, 2014    No past surgical history on file.   Family History  Problem Relation Age of  Onset   Thyroid  disease Mother    Hypertension Mother    Hypercholesterolemia Mother    Arthritis Mother    Diabetes Mother    Diabetes Father    Hypertension Father    Prostate cancer Father    Cancer Father    Diabetes Paternal Grandmother    Diabetes Paternal Grandfather    ADD / ADHD Daughter    ADD / ADHD Son    Colon polyps Paternal Aunt    Colon cancer Neg Hx    Breast cancer Neg Hx     Social History   Tobacco Use   Smoking status: Never   Smokeless tobacco: Never  Vaping Use   Vaping status: Never Used  Substance Use Topics   Alcohol use: No   Drug use: No    Review of Systems:    Review of Systems  Constitutional:  Negative for chills, fever, malaise/fatigue and weight loss.  HENT:  Negative for hearing loss, sinus pain and sore throat.   Respiratory:  Negative for cough and hemoptysis.   Cardiovascular:  Negative for chest pain, palpitations, leg swelling and PND.  Gastrointestinal:  Negative for abdominal pain, constipation, diarrhea, heartburn, nausea and vomiting.  Genitourinary:  Negative for dysuria, frequency and urgency.  Musculoskeletal:  Negative for back pain, myalgias and neck pain.  Skin:  Negative for itching and rash.  Neurological:  Negative for dizziness, tingling, seizures and headaches.  Endo/Heme/Allergies:  Negative for polydipsia.  Psychiatric/Behavioral:  Negative for depression. The patient is not nervous/anxious.     Objective:   BP (!) 140/90 (BP Location: Left Arm, Patient Position: Sitting, Cuff Size: Normal)   Pulse 74   Temp (!) 97.3 F (36.3 C) (Temporal)   Ht 5' 9 (1.753 m)   Wt 167 lb 8 oz (76 kg)   SpO2 98%   BMI 24.74 kg/m  Body mass index is 24.74 kg/m.   General Appearance:    Alert, cooperative, no distress, appears stated age  Head:    Normocephalic, without obvious abnormality, atraumatic  Eyes:    PERRL, conjunctiva/corneas clear, EOM's intact, fundi    benign, both eyes  Ears:    Normal TM's and external ear canals, both ears  Nose:   Nares normal, septum midline, mucosa normal, no drainage    or sinus tenderness  Throat:   Lips, mucosa, and tongue normal; teeth and gums normal  Neck:   Supple, symmetrical, trachea midline, no adenopathy;    thyroid :  no enlargement/tenderness/nodules; no carotid   bruit or JVD  Back:     Symmetric, no curvature, ROM normal, no CVA tenderness  Lungs:     Clear to auscultation bilaterally, respirations unlabored  Chest Wall:    No tenderness or deformity   Heart:    Regular rate and rhythm, S1 and S2 normal, no murmur, rub or gallop  Breast Exam:    Deferred  Abdomen:      Soft, non-tender, bowel sounds active all four quadrants,    no masses, no organomegaly  Genitalia:    Deferred  Extremities:   Extremities normal, atraumatic, no cyanosis or edema  Pulses:   2+ and symmetric all extremities  Skin:   Skin color, texture, turgor normal, no rashes or lesions  Lymph nodes:   Cervical, supraclavicular, and axillary nodes normal  Neurologic:   CNII-XII intact, normal strength, sensation and reflexes    throughout    Assessment/Plan:   Routine physical examination Today patient counseled on age appropriate  routine health concerns for screening and prevention, each reviewed and up to date or declined. Immunizations reviewed and up to date or declined. Labs ordered and reviewed. Risk factors for depression reviewed and negative. Hearing function and visual acuity are intact. ADLs screened and addressed as needed. Functional ability and level of safety reviewed and appropriate. Education, counseling and referrals performed based on assessed risks today. Patient provided with a copy of personalized plan for preventive services.  Primary hypertension Above goal today No evidence of end-organ damage on my exam Recommend patient monitor home blood pressure at least a few times weekly Continue amlodipine  10 mg daily If home monitoring shows consistent elevation, or any symptom(s) develop, recommend reach out to us  for further advice on next steps  Insulin resistance Update Hemoglobin A1c and follow up -- too soon to recheck Continue efforts at healthy lifestyle  Hyperlipidemia, unspecified hyperlipidemia type Update lipid panel and adjust Lipitor as indicated  Elevated liver enzymes Update liver labs to trend, especially in response to starting new statin  Iron deficiency anemia, unspecified iron deficiency anemia type Update iron panel and provide recommendations   Lucie Buttner, PA-C [facility],acting as a Neurosurgeon for  [employer], PA.,have documented all relevant documentation on the behalf of Lucie Buttner, PA,as directed by  Lucie Buttner, PA while in the presence of Lucie Buttner, [place].   I, Lucie Buttner, [place], have reviewed all documentation for this visit. The documentation on 10/23/23 for the exam, diagnosis, procedures, and orders are all accurate and complete.

## 2023-10-23 NOTE — Patient Instructions (Addendum)
 It was great to see you!  Make sure you get your annual mammogram   Please go to the lab for blood work.   Our office will call you with your results unless you have chosen to receive results via MyChart.  If your blood work is normal we will follow-up each year for physicals and as scheduled for chronic medical problems.  If anything is abnormal we will treat accordingly and get you in for a follow-up.  Take care,  Shadawn Hanaway

## 2023-10-26 ENCOUNTER — Ambulatory Visit: Payer: Self-pay | Admitting: Physician Assistant

## 2023-10-26 DIAGNOSIS — D509 Iron deficiency anemia, unspecified: Secondary | ICD-10-CM

## 2023-10-26 DIAGNOSIS — E785 Hyperlipidemia, unspecified: Secondary | ICD-10-CM

## 2023-12-30 ENCOUNTER — Ambulatory Visit: Admitting: Physician Assistant

## 2024-10-28 ENCOUNTER — Encounter: Admitting: Physician Assistant
